# Patient Record
Sex: Male | Born: 1935 | Race: White | Hispanic: No | Marital: Married | Smoking: Former smoker
Health system: Southern US, Community
[De-identification: ages and names within clinical notes are randomized; demographics above are authoritative.]

## PROBLEM LIST (undated history)

## (undated) DIAGNOSIS — E7800 Pure hypercholesterolemia, unspecified: Secondary | ICD-10-CM

## (undated) DIAGNOSIS — R0600 Dyspnea, unspecified: Secondary | ICD-10-CM

## (undated) DIAGNOSIS — I10 Essential (primary) hypertension: Secondary | ICD-10-CM

## (undated) DIAGNOSIS — J9383 Other pneumothorax: Secondary | ICD-10-CM

## (undated) DIAGNOSIS — R011 Cardiac murmur, unspecified: Secondary | ICD-10-CM

## (undated) DIAGNOSIS — J8410 Pulmonary fibrosis, unspecified: Secondary | ICD-10-CM

## (undated) HISTORY — PX: TONSILLECTOMY: SUR1361

---

## 2005-10-01 ENCOUNTER — Ambulatory Visit (HOSPITAL_COMMUNITY): Admission: RE | Admit: 2005-10-01 | Discharge: 2005-10-01 | Payer: Self-pay | Admitting: Internal Medicine

## 2006-08-10 HISTORY — PX: COLONOSCOPY: SHX174

## 2007-04-01 ENCOUNTER — Ambulatory Visit: Payer: Self-pay | Admitting: Internal Medicine

## 2007-04-01 ENCOUNTER — Ambulatory Visit (HOSPITAL_COMMUNITY): Admission: RE | Admit: 2007-04-01 | Discharge: 2007-04-01 | Payer: Self-pay | Admitting: Internal Medicine

## 2007-04-04 ENCOUNTER — Encounter: Payer: Self-pay | Admitting: Internal Medicine

## 2010-03-05 ENCOUNTER — Ambulatory Visit: Payer: Self-pay | Admitting: Cardiovascular Disease

## 2010-03-05 ENCOUNTER — Encounter (INDEPENDENT_AMBULATORY_CARE_PROVIDER_SITE_OTHER): Payer: Self-pay | Admitting: Internal Medicine

## 2010-03-05 ENCOUNTER — Ambulatory Visit (HOSPITAL_COMMUNITY): Admission: RE | Admit: 2010-03-05 | Discharge: 2010-03-05 | Payer: Self-pay | Admitting: Internal Medicine

## 2010-12-23 NOTE — Op Note (Signed)
NAME:  Charles [name], Charles [name]              ACCOUNT NO.:  [id number]   MEDICAL RECORD NO.:  [id number]          PATIENT TYPE:  AMB   LOCATION:  DAY                           FACILITY:  [facility]   PHYSICIAN:  R. Roetta Sessions, M.D. DATE OF BIRTH:  Dec 17, 1932   DATE OF PROCEDURE:  04/01/2007  DATE OF DISCHARGE:                               OPERATIVE REPORT   PROCEDURE PERFORMED:  Colonoscopy, snare polypectomy, and biopsy.   INDICATIONS FOR PROCEDURE:  77 year old gentleman with no lower GI tract  symptoms sent over through the courtesy of Dr. Carylon Perches for his first  ever screening colonoscopy.  There is no family history of colorectal  neoplasia.  Colonoscopy is now being done as a standard screening  maneuver.  This approach has been discussed with the patient at length.  The potential risks, benefits and alternatives have been reviewed,  questions answered and he is agreeable.  Please see documentation in the  medical record.   PROCEDURE NOTE:  O2 saturation, blood pressure, pulse rate and  oxygenation were monitored throughout the entire procedure.  Conscious  station with Versed 5 mg IV, Demerol 775 mg IV in divided doses.  Instrument Pentax video chip system.   FINDINGS:  Digital rectal exam revealed no abnormalities.  The prep was  adequate.  The colonic mucosa was surveyed from the rectosigmoid  junction through the left, transverse, right colon, to appendiceal  orifice, ileocecal valve, and cecum.  These structures were well seen  and photographed for the record.  From this level, the scope was slowly  and cautiously withdrawn. All previously mentioned mucosal surfaces were  again seen. The following abnormalities were noted.  1. The patient had sigmoid diverticula.  2. The patient had an 8 mm sessile polyp in the base of the cecum with      multiple adjacent diminutive polyps.  The sessile polyp was cold      snared and recovered through the scope.  The smaller polyps were  removed with cold biopsy forceps technique.  The remainder of the      colonic mucosa appeared normal.  The scope was pulled down to the      rectum where thorough examination of the rectal mucosa including      retroflex view of the anal verge demonstrated no abnormalities.      The patient tolerated the procedure well, was reacted in endoscopy.   IMPRESSION:  1. Normal rectum.  2. Sigmoid diverticula.  Cecal polyps removed as described above.   RECOMMENDATION:  1. No aspirin or NSAID medications for ten days.  2. Follow up on path.  3. Diverticulosis literature provided to Mr. Proby.  4. Further recommendations to follow.      Charles [name], M.D.  Electronically Signed     RMR/MEDQ  D:  04/01/2007  T:  04/02/2007  Job:  [id number]   cc:   Kingsley Callander. Ouida Sills, MD  Fax: [phone]

## 2011-11-18 ENCOUNTER — Emergency Department (HOSPITAL_COMMUNITY): Payer: Medicare HMO

## 2011-11-18 ENCOUNTER — Inpatient Hospital Stay (HOSPITAL_COMMUNITY)
Admission: EM | Admit: 2011-11-18 | Discharge: 2011-12-01 | DRG: 164 | Disposition: A | Discharge status: Home / Self Care | Payer: Medicare HMO | Attending: Thoracic Surgery (Cardiothoracic Vascular Surgery) | Admitting: Thoracic Surgery (Cardiothoracic Vascular Surgery)

## 2011-11-18 ENCOUNTER — Encounter (HOSPITAL_COMMUNITY): Payer: Self-pay | Admitting: Emergency Medicine

## 2011-11-18 DIAGNOSIS — K5900 Constipation, unspecified: Secondary | ICD-10-CM | POA: Diagnosis present

## 2011-11-18 DIAGNOSIS — D72829 Elevated white blood cell count, unspecified: Secondary | ICD-10-CM | POA: Diagnosis present

## 2011-11-18 DIAGNOSIS — N289 Disorder of kidney and ureter, unspecified: Secondary | ICD-10-CM | POA: Diagnosis present

## 2011-11-18 DIAGNOSIS — J449 Chronic obstructive pulmonary disease, unspecified: Secondary | ICD-10-CM | POA: Diagnosis present

## 2011-11-18 DIAGNOSIS — Z87891 Personal history of nicotine dependence: Secondary | ICD-10-CM

## 2011-11-18 DIAGNOSIS — E871 Hypo-osmolality and hyponatremia: Secondary | ICD-10-CM | POA: Diagnosis present

## 2011-11-18 DIAGNOSIS — D62 Acute posthemorrhagic anemia: Secondary | ICD-10-CM | POA: Diagnosis not present

## 2011-11-18 DIAGNOSIS — J9383 Other pneumothorax: Secondary | ICD-10-CM | POA: Diagnosis present

## 2011-11-18 DIAGNOSIS — E876 Hypokalemia: Secondary | ICD-10-CM | POA: Diagnosis not present

## 2011-11-18 DIAGNOSIS — E785 Hyperlipidemia, unspecified: Secondary | ICD-10-CM | POA: Diagnosis present

## 2011-11-18 DIAGNOSIS — J4489 Other specified chronic obstructive pulmonary disease: Secondary | ICD-10-CM | POA: Diagnosis present

## 2011-11-18 DIAGNOSIS — I10 Essential (primary) hypertension: Secondary | ICD-10-CM | POA: Diagnosis present

## 2011-11-18 HISTORY — DX: Pure hypercholesterolemia, unspecified: E78.00

## 2011-11-18 HISTORY — DX: Essential (primary) hypertension: I10

## 2011-11-18 HISTORY — DX: Other pneumothorax: J93.83

## 2011-11-18 LAB — HEPATIC FUNCTION PANEL
ALT: 12 U/L (ref 0–53)
Albumin: 4.2 g/dL (ref 3.5–5.2)
Alkaline Phosphatase: 102 U/L (ref 39–117)
Total Bilirubin: 0.6 mg/dL (ref 0.3–1.2)
Total Protein: 7.8 g/dL (ref 6.0–8.3)

## 2011-11-18 LAB — CBC
Hemoglobin: 13.1 g/dL (ref 13.0–17.0)
MCH: 35.4 pg — ABNORMAL HIGH (ref 26.0–34.0)
MCV: 101.9 fL — ABNORMAL HIGH (ref 78.0–100.0)
RBC: 3.7 MIL/uL — ABNORMAL LOW (ref 4.22–5.81)
WBC: 9.8 10*3/uL (ref 4.0–10.5)

## 2011-11-18 LAB — BASIC METABOLIC PANEL
CO2: 23 mEq/L (ref 19–32)
Calcium: 10.1 mg/dL (ref 8.4–10.5)
Chloride: 101 mEq/L (ref 96–112)
Creatinine, Ser: 1.16 mg/dL (ref 0.50–1.35)
Glucose, Bld: 178 mg/dL — ABNORMAL HIGH (ref 70–99)
Sodium: 138 mEq/L (ref 135–145)

## 2011-11-18 LAB — LIPASE, BLOOD: Lipase: 64 U/L — ABNORMAL HIGH (ref 11–59)

## 2011-11-18 MED ORDER — SODIUM CHLORIDE 0.9 % IV SOLN
INTRAVENOUS | Status: DC
  Administered 2011-11-18: 1000 mL via INTRAVENOUS
  Administered 2011-11-18 – 2011-11-19 (×2): via INTRAVENOUS
  Administered 2011-11-19: 1000 mL via INTRAVENOUS
  Administered 2011-11-20 – 2011-11-22 (×6): via INTRAVENOUS
  Administered 2011-11-23: 10 mL/h via INTRAVENOUS
  Administered 2011-11-23: 09:00:00 via INTRAVENOUS
  Administered 2011-11-24: 10 mL/h via INTRAVENOUS

## 2011-11-18 MED ORDER — HYDROCODONE-ACETAMINOPHEN 5-325 MG PO TABS
1.0000 | ORAL_TABLET | ORAL | Status: DC | PRN
  Administered 2011-11-23 – 2011-11-24 (×2): 2 via ORAL
  Filled 2011-11-18 (×2): qty 2

## 2011-11-18 MED ORDER — ONDANSETRON HCL 4 MG/2ML IJ SOLN: 4.0000 mg | Freq: Four times a day (QID) | INTRAMUSCULAR | Status: DC | PRN

## 2011-11-18 MED ORDER — ONDANSETRON HCL 4 MG/2ML IJ SOLN
4.0000 mg | Freq: Once | INTRAMUSCULAR | Status: AC
  Administered 2011-11-18: 4 mg via INTRAVENOUS
  Filled 2011-11-18: qty 2

## 2011-11-18 MED ORDER — ENOXAPARIN SODIUM 40 MG/0.4ML ~~LOC~~ SOLN
40.0000 mg | Freq: Every day | SUBCUTANEOUS | Status: DC
  Administered 2011-11-18 – 2011-11-24 (×6): 40 mg via SUBCUTANEOUS
  Filled 2011-11-18 (×6): qty 0.4

## 2011-11-18 MED ORDER — SODIUM CHLORIDE 0.9 % IV BOLUS (SEPSIS)
250.0000 mL | Freq: Once | INTRAVENOUS | Status: AC
  Administered 2011-11-18: 250 mL via INTRAVENOUS

## 2011-11-18 MED ORDER — MORPHINE SULFATE 2 MG/ML IJ SOLN
2.0000 mg | Freq: Once | INTRAMUSCULAR | Status: AC
  Administered 2011-11-18: 2 mg via INTRAVENOUS
  Filled 2011-11-18: qty 1

## 2011-11-18 MED ORDER — BIOTENE DRY MOUTH MT LIQD
15.0000 mL | Freq: Two times a day (BID) | OROMUCOSAL | Status: DC
  Administered 2011-11-18 – 2011-12-01 (×18): 15 mL via OROMUCOSAL

## 2011-11-18 MED ORDER — NITROGLYCERIN 0.4 MG SL SUBL: 0.4000 mg | SUBLINGUAL_TABLET | SUBLINGUAL | Status: DC | PRN

## 2011-11-18 MED ORDER — PANTOPRAZOLE SODIUM 40 MG IV SOLR
40.0000 mg | Freq: Every day | INTRAVENOUS | Status: DC
  Administered 2011-11-18 – 2011-11-23 (×4): 40 mg via INTRAVENOUS
  Filled 2011-11-18 (×6): qty 40

## 2011-11-18 NOTE — H&P (Signed)
Charles [name] is an 78 y.o. male.   Chief Complaint: Chest pain and SOB HPI: Patient presented to [facility] with increasing Right sided chest pain which started this AM.  Had been out chopping wood when the pain started.  It had progressed and the patient presented to the ED.  No similar symptoms in the past.  No other trauma.  No History of any pulmonary disease.  Currently has chest tightness but denies any air hunger.  Past Medical History  Diagnosis Date  . Hypertension   . Hypercholesteremia     Past Surgical History  Procedure Date  . Tonsillectomy     History reviewed. No pertinent family history. Social History:  reports that he quit smoking about 28 years ago. His smoking use included Cigarettes. He quit after 22 years of use. He does not have any smokeless tobacco history on file. He reports that he drinks about 6 ounces of alcohol per week. He reports that he does not use illicit drugs.  Allergies: No Known Allergies  Medications Prior to Admission  Medication Dose Route Frequency Provider Last Rate Last Dose  . 0.9 %  sodium chloride infusion   Intravenous Continuous Shelda Jakes, MD 100 mL/hr at 11/18/11 1127    . antiseptic oral rinse (BIOTENE) solution 15 mL  15 mL Mouth Rinse BID Fabio Bering, MD      . enoxaparin (LOVENOX) injection 40 mg  40 mg Subcutaneous Daily Fabio Bering, MD   40 mg at 11/18/11 1323  . HYDROcodone-acetaminophen (NORCO) 5-325 MG per tablet 1-2 tablet  1-2 tablet Oral Q4H PRN Fabio Bering, MD      . morphine 2 MG/ML injection 2 mg  2 mg Intravenous Once Fabio Bering, MD   2 mg at 11/18/11 1256  . nitroGLYCERIN (NITROSTAT) SL tablet 0.4 mg  0.4 mg Sublingual Q5 min PRN Shelda Jakes, MD      . ondansetron [facility]) injection 4 mg  4 mg Intravenous Once Shelda Jakes, MD   4 mg at 11/18/11 1127  . ondansetron (ZOFRAN) injection 4 mg  4 mg Intravenous Q6H PRN Fabio Bering, MD      . pantoprazole (PROTONIX) injection 40 mg  40  mg Intravenous QHS Fabio Bering, MD      . sodium chloride 0.9 % bolus 250 mL  250 mL Intravenous Once Shelda Jakes, MD   250 mL at 11/18/11 1126   Medications Prior to Admission  Medication Sig Dispense Refill  . allopurinol (ZYLOPRIM) 300 MG tablet Take 300 mg by mouth daily.      Marland Kitchen amLODipine (NORVASC) 10 MG tablet Take 10 mg by mouth daily.      . carvedilol (COREG) 6.25 MG tablet Take 6.25 mg by mouth 2 (two) times daily with a meal.      . losartan (COZAAR) 100 MG tablet Take 100 mg by mouth daily.      Marland Kitchen lovastatin (MEVACOR) 10 MG tablet Take 10 mg by mouth at bedtime.        Results for orders placed during the hospital encounter of 11/18/11 (from the past 48 hour(s))  CBC     Status: Abnormal   Collection Time   11/18/11 11:19 AM      Component Value Range Comment   WBC 9.8  4.0 - 10.5 (K/uL)    RBC 3.70 (*) 4.22 - 5.81 (MIL/uL)    Hemoglobin 13.1  13.0 - 17.0 (g/dL)  HCT 37.7 (*) 39.0 - 52.0 (%)    MCV 101.9 (*) 78.0 - 100.0 (fL)    MCH 35.4 (*) 26.0 - 34.0 (pg)    MCHC 34.7  30.0 - 36.0 (g/dL)    RDW 40.9  81.1 - 91.4 (%)    Platelets 243  150 - 400 (K/uL)   BASIC METABOLIC PANEL     Status: Abnormal   Collection Time   11/18/11 11:19 AM      Component Value Range Comment   Sodium 138  135 - 145 (mEq/L)    Potassium 3.9  3.5 - 5.1 (mEq/L)    Chloride 101  96 - 112 (mEq/L)    CO2 23  19 - 32 (mEq/L)    Glucose, Bld 178 (*) 70 - 99 (mg/dL)    BUN 28 (*) 6 - 23 (mg/dL)    Creatinine, Ser 7.82  0.50 - 1.35 (mg/dL)    Calcium 95.6  8.4 - 10.5 (mg/dL)    GFR calc non Af Amer 59 (*) >90 (mL/min)    GFR calc Af Amer 69 (*) >90 (mL/min)   HEPATIC FUNCTION PANEL     Status: Normal   Collection Time   11/18/11 11:19 AM      Component Value Range Comment   Total Protein 7.8  6.0 - 8.3 (g/dL)    Albumin 4.2  3.5 - 5.2 (g/dL)    AST 29  0 - 37 (U/L)    ALT 12  0 - 53 (U/L)    Alkaline Phosphatase 102  39 - 117 (U/L)    Total Bilirubin 0.6  0.3 - 1.2 (mg/dL)     Bilirubin, Direct 0.1  0.0 - 0.3 (mg/dL)    Indirect Bilirubin 0.5  0.3 - 0.9 (mg/dL)   LIPASE, BLOOD     Status: Abnormal   Collection Time   11/18/11 11:19 AM      Component Value Range Comment   Lipase 64 (*) 11 - 59 (U/L)   TROPONIN I     Status: Normal   Collection Time   11/18/11 11:19 AM      Component Value Range Comment   Troponin I <0.30  <0.30 (ng/mL)    Dg Chest Portable 1 View  11/18/2011  *RADIOLOGY REPORT*  Clinical Data: Chest pain shortness of breath this morning.  PORTABLE CHEST - 1 VIEW  Comparison: None.  Findings: Patient rotated to the left.  No gross tracheal deviation.  Moderate cardiomegaly.  The heart is displaced minimally to the left hemithorax.  No pleural fluid.  A large right- sided pneumothorax, approximately 70%.  Underlying right lung atelectasis.  Interstitial thickening in the left lung without definite focal consolidation.  IMPRESSION: Large right-sided pneumothorax, with mild cardiac/mediastinal deviation to the left. Critical test results telephoned to Dr. Deretha Emory at the time of interpretation at 11:45 a.m. on 11/18/2011.  Original Report Authenticated By: Consuello Bossier, M.D.   Dg Chest Port 1v Same Day  11/18/2011  *RADIOLOGY REPORT*  Clinical Data:  pneumothorax.  PORTABLE CHEST - 1 VIEW SAME DAY  Comparison: 11/18/2011  Findings: Small bore chest tube is noted laterally in the right hemithorax.  Re-expansion of the right lung.  Small right apical pneumothorax persists.  Areas of atelectasis in the right lung. Cardiomegaly with interstitial prominence throughout the lungs. Bibasilar atelectasis.  IMPRESSION: Interval placement of small lateral right chest tube.  Re-expansion of the right lung with small residual right apical pneumothorax.  Cardiomegaly, diffuse interstitial prominence throughout the  lungs.  Bibasilar atelectasis.  Original Report Authenticated By: Cyndie Chime, M.D.    Review of Systems  Constitutional: Negative.   HENT: Negative.     Eyes: Negative.   Respiratory: Positive for shortness of breath.   Cardiovascular: Positive for chest pain.  Gastrointestinal: Negative.   Genitourinary: Negative.   Musculoskeletal: Negative.   Skin: Negative.   Neurological: Negative.   Endo/Heme/Allergies: Negative.   Psychiatric/Behavioral: Negative.     Blood pressure 158/76, pulse 80, temperature 98 F (36.7 C), temperature source Oral, resp. rate 20, height 5\' 11"  (1.803 m), weight 91.627 kg (202 lb), SpO2 96.00%. Physical Exam  Constitutional: He is oriented to person, place, and time. He appears well-developed and well-nourished. No distress.  HENT:  Head: Normocephalic and atraumatic.  Eyes: Conjunctivae and EOM are normal. Pupils are equal, round, and reactive to light.  Neck: Normal range of motion. Neck supple. No tracheal deviation present. No thyromegaly present.  Cardiovascular: Normal rate, regular rhythm and normal heart sounds.   Respiratory: No respiratory distress.       Decreased breath sounds on the Right.  NO crepitance.  Musculoskeletal: Normal range of motion.  Lymphadenopathy:    He has no cervical adenopathy.  Neurological: He is alert and oriented to person, place, and time.  Skin: Skin is warm and dry.     Assessment/Plan Right pneumothorax.  Findings and etiologies discussed with patient.  As this is a spontaneous PTX, options including pneumocath as well as formal thoracostomy tube placement were discussed.  Risks, benefits, and alternatives were discussed.  Patient will have pneumocath placed in ED and then will be admitted for continued inpatient management.  Danene Montijo C 11/18/2011, 9:53 PM

## 2011-11-18 NOTE — ED Notes (Signed)
Pt states he was splitting wood and developed central chest pain. Pt denies any chest pain at this time, but c/o sob.

## 2011-11-18 NOTE — Procedures (Signed)
Chest Tube Insertion Procedure Note  Indications:  Clinically significant Pneumothorax  Pre-operative Diagnosis: Pneumothorax  Post-operative Diagnosis: Pneumothorax  Procedure Details  Informed consent was obtained for the procedure, including sedation.  Risks of lung perforation, hemorrhage, arrhythmia, and adverse drug reaction were discussed.   After sterile skin prep, using standard technique, a 7 French tube was placed in the right lateral approximate 4-5th rib space.  Findings: Air return  Estimated Blood Loss:  Minimal         Specimens:  None              Complications:  None; patient tolerated the procedure well.         Disposition: Admission         Condition: stable  Attending Attestation: I performed the procedure.

## 2011-11-18 NOTE — Progress Notes (Signed)
Did some teaching with patient regarding chest tubes and pneumothorax.  Gave him handouts as well.  He will let us know if he has any further questions.    Reece Packer, RN

## 2011-11-18 NOTE — Progress Notes (Addendum)
Patient brought to room from ER with chest tube in right side of chest.  When connected chest tube to wall suction, large amount of bubbling seen in pleuravac.  Called charge nurse Loletta Specter and Karl Luke in room to further evaluate.  Also had Tory Emerald, [facility] RN come up to check chest tube.  All nurses in agreeance that chest tube may have a possible air leak.  Called Dr. Leticia Penna.  No new orders from MD at this time.  MD stated "it had a good seal on chest xray, call back if any shortness of breath or increased pain."  Patient in no respiratory distress at this time, resting well in bed.  Will continue to monitor.  Schonewitz, Candelaria Stagers 11/18/2011

## 2011-11-18 NOTE — ED Provider Notes (Signed)
History   This chart was scribed for Shelda Jakes, MD by Charles [name]. The patient was seen in room [facility]. Patient's care was started at 1023.   CSN: [id number]  Arrival date & time 11/18/11  1023   First MD Initiated Contact with Patient 11/18/11 1026      Chief Complaint  Patient presents with  . Chest Pain    (Consider location/radiation/quality/duration/timing/severity/associated sxs/prior treatment) HPI  Charles [name] is a 78 y.o. male who presents to the Emergency Department complaining of constant moderate chest pain onset one and half hours ago and persistent since with associated SOB and back pain. Patient had been splitting wood for 45 minutes when chest pain occurred and described the pain as a pressure and constriction feeling with a 3/10 severity. Patient took 2x 325mg  Aspirin PTA with some relief. Patient denies a history of similar chest pain. Patient with history of HTN.    Past Medical History  Diagnosis Date  . Hypertension   . Hypercholesteremia     History reviewed. No pertinent past surgical history.  History reviewed. No pertinent family history.  History  Substance Use Topics  . Smoking status: Not on file  . Smokeless tobacco: Not on file  . Alcohol Use: Yes     heavy      Review of Systems  Constitutional: Negative for fever and chills.  HENT: Negative for congestion, sore throat and neck pain.   Respiratory: Positive for cough.   Cardiovascular: Positive for chest pain.  Gastrointestinal: Negative for nausea, vomiting and diarrhea.  Genitourinary: Negative for dysuria.  Musculoskeletal: Positive for back pain. Negative for joint swelling.  Skin: Negative for rash.  Neurological: Negative for headaches.  All other systems reviewed and are negative.    Allergies  Review of patient's allergies indicates no known allergies.  Home Medications  No current outpatient prescriptions on file.  BP 159/77  Pulse 83   Temp(Src) 98.1 F (36.7 C) (Oral)  Resp 22  Ht 5\' 11"  (1.803 m)  Wt 220 lb (99.791 kg)  BMI 30.68 kg/m2  SpO2 97%  Physical Exam  Nursing note and vitals reviewed. Constitutional: He is oriented to person, place, and time. He appears well-developed and well-nourished. No distress.  HENT:  Head: Normocephalic and atraumatic.  Eyes: EOM are normal. Pupils are equal, round, and reactive to light.  Neck: Neck supple. No tracheal deviation present.  Cardiovascular: Normal rate and normal heart sounds.  Exam reveals no gallop and no friction rub.   No murmur heard. Pulmonary/Chest: Effort normal. No respiratory distress. He has no wheezes. He has no rales.  Abdominal: Soft. Bowel sounds are normal. He exhibits no distension. There is no tenderness.  Musculoskeletal: Normal range of motion. He exhibits no edema and no tenderness.  Lymphadenopathy:    He has no cervical adenopathy.  Neurological: He is alert and oriented to person, place, and time. No cranial nerve deficit or sensory deficit. He exhibits normal muscle tone. Coordination normal.  Skin: Skin is warm and dry.  Psychiatric: He has a normal mood and affect. His behavior is normal.    ED Course  Procedures (including critical care time) DIAGNOSTIC STUDIES: Oxygen Saturation is 88% on Pollard, low by my interpretation.    COORDINATION OF CARE: 11:00AM- Informed patient of possible admission for chest pain and ordering CT angiogram of chest.  12:00AM - Informed patient of right pneumothorax. Calling surgeon for placement of chest tube.   Labs Reviewed  CBC - Abnormal;  Notable for the following:    RBC 3.70 (*)    HCT 37.7 (*)    MCV 101.9 (*)    MCH 35.4 (*)    All other components within normal limits  BASIC METABOLIC PANEL - Abnormal; Notable for the following:    Glucose, Bld 178 (*)    BUN 28 (*)    GFR calc non Af Amer 59 (*)    GFR calc Af Amer 69 (*)    All other components within normal limits  LIPASE, BLOOD -  Abnormal; Notable for the following:    Lipase 64 (*)    All other components within normal limits  HEPATIC FUNCTION PANEL  TROPONIN I   Dg Chest Portable 1 View  11/18/2011  *RADIOLOGY REPORT*  Clinical Data: Chest pain shortness of breath this morning.  PORTABLE CHEST - 1 VIEW  Comparison: None.  Findings: Patient rotated to the left.  No gross tracheal deviation.  Moderate cardiomegaly.  The heart is displaced minimally to the left hemithorax.  No pleural fluid.  A large right- sided pneumothorax, approximately 70%.  Underlying right lung atelectasis.  Interstitial thickening in the left lung without definite focal consolidation.  IMPRESSION: Large right-sided pneumothorax, with mild cardiac/mediastinal deviation to the left. Critical test results telephoned to Dr. Deretha Emory at the time of interpretation at 11:45 a.m. on 11/18/2011.  Original Report Authenticated By: Consuello Bossier, M.D.   Dg Chest Port 1v Same Day  11/18/2011  *RADIOLOGY REPORT*  Clinical Data:  pneumothorax.  PORTABLE CHEST - 1 VIEW SAME DAY  Comparison: 11/18/2011  Findings: Small bore chest tube is noted laterally in the right hemithorax.  Re-expansion of the right lung.  Small right apical pneumothorax persists.  Areas of atelectasis in the right lung. Cardiomegaly with interstitial prominence throughout the lungs. Bibasilar atelectasis.  IMPRESSION: Interval placement of small lateral right chest tube.  Re-expansion of the right lung with small residual right apical pneumothorax.  Cardiomegaly, diffuse interstitial prominence throughout the lungs.  Bibasilar atelectasis.  Original Report Authenticated By: Cyndie Chime, M.D.     Date: 11/18/2011  Rate: 79  Rhythm: normal sinus rhythm  QRS Axis: normal  Intervals: normal  ST/T Wave abnormalities: normal  Conduction Disutrbances:none  Narrative Interpretation:   Old EKG Reviewed: none available  Results for orders placed during the hospital encounter of 11/18/11  CBC        Component Value Range   WBC 9.8  4.0 - 10.5 (K/uL)   RBC 3.70 (*) 4.22 - 5.81 (MIL/uL)   Hemoglobin 13.1  13.0 - 17.0 (g/dL)   HCT 16.1 (*) 09.6 - 52.0 (%)   MCV 101.9 (*) 78.0 - 100.0 (fL)   MCH 35.4 (*) 26.0 - 34.0 (pg)   MCHC 34.7  30.0 - 36.0 (g/dL)   RDW 04.5  40.9 - 81.1 (%)   Platelets 243  150 - 400 (K/uL)  BASIC METABOLIC PANEL      Component Value Range   Sodium 138  135 - 145 (mEq/L)   Potassium 3.9  3.5 - 5.1 (mEq/L)   Chloride 101  96 - 112 (mEq/L)   CO2 23  19 - 32 (mEq/L)   Glucose, Bld 178 (*) 70 - 99 (mg/dL)   BUN 28 (*) 6 - 23 (mg/dL)   Creatinine, Ser 9.14  0.50 - 1.35 (mg/dL)   Calcium 78.2  8.4 - 10.5 (mg/dL)   GFR calc non Af Amer 59 (*) >90 (mL/min)   GFR calc Af  Amer 69 (*) >90 (mL/min)  HEPATIC FUNCTION PANEL      Component Value Range   Total Protein 7.8  6.0 - 8.3 (g/dL)   Albumin 4.2  3.5 - 5.2 (g/dL)   AST 29  0 - 37 (U/L)   ALT 12  0 - 53 (U/L)   Alkaline Phosphatase 102  39 - 117 (U/L)   Total Bilirubin 0.6  0.3 - 1.2 (mg/dL)   Bilirubin, Direct 0.1  0.0 - 0.3 (mg/dL)   Indirect Bilirubin 0.5  0.3 - 0.9 (mg/dL)  LIPASE, BLOOD      Component Value Range   Lipase 64 (*) 11 - 59 (U/L)  TROPONIN I      Component Value Range   Troponin I <0.30  <0.30 (ng/mL)    Diagnosis: Right pneumothorax spontaneous   Results for orders placed during the hospital encounter of 11/18/11  CBC      Component Value Range   WBC 9.8  4.0 - 10.5 (K/uL)   RBC 3.70 (*) 4.22 - 5.81 (MIL/uL)   Hemoglobin 13.1  13.0 - 17.0 (g/dL)   HCT 14.7 (*) 82.9 - 52.0 (%)   MCV 101.9 (*) 78.0 - 100.0 (fL)   MCH 35.4 (*) 26.0 - 34.0 (pg)   MCHC 34.7  30.0 - 36.0 (g/dL)   RDW 56.2  13.0 - 86.5 (%)   Platelets 243  150 - 400 (K/uL)  BASIC METABOLIC PANEL      Component Value Range   Sodium 138  135 - 145 (mEq/L)   Potassium 3.9  3.5 - 5.1 (mEq/L)   Chloride 101  96 - 112 (mEq/L)   CO2 23  19 - 32 (mEq/L)   Glucose, Bld 178 (*) 70 - 99 (mg/dL)   BUN 28 (*) 6 - 23  (mg/dL)   Creatinine, Ser 7.84  0.50 - 1.35 (mg/dL)   Calcium 69.6  8.4 - 10.5 (mg/dL)   GFR calc non Af Amer 59 (*) >90 (mL/min)   GFR calc Af Amer 69 (*) >90 (mL/min)  HEPATIC FUNCTION PANEL      Component Value Range   Total Protein 7.8  6.0 - 8.3 (g/dL)   Albumin 4.2  3.5 - 5.2 (g/dL)   AST 29  0 - 37 (U/L)   ALT 12  0 - 53 (U/L)   Alkaline Phosphatase 102  39 - 117 (U/L)   Total Bilirubin 0.6  0.3 - 1.2 (mg/dL)   Bilirubin, Direct 0.1  0.0 - 0.3 (mg/dL)   Indirect Bilirubin 0.5  0.3 - 0.9 (mg/dL)  LIPASE, BLOOD      Component Value Range   Lipase 64 (*) 11 - 59 (U/L)  TROPONIN I      Component Value Range   Troponin I <0.30  <0.30 (ng/mL)     MDM  Patient presented with chest pain radiating to the back along with shortness of breath initially concerned about cardiac or dissection of the thoracic aorta however chest x-ray revealed a large right-sided pneumothorax 70% or greater. Dr. Leticia Penna for surgery was contacted he came in and placed a small pneumocath to reinflate the lung and is admitted the patient. Troponin was negative rest of labs without any significant findings CT angiogram was originally ordered was canceled after the chest x-ray showed a large pneumothorax.      I personally performed the services described in this documentation, which was scribed in my presence. The recorded information has been reviewed and considered.  Shelda Jakes, MD 11/18/11 [phone]

## 2011-11-18 NOTE — ED Notes (Signed)
Pt states that while out splitting wood today became sob and had cp. Pt states he got a little over heated in the sun today. Pt denies cp at this time and states still sob.

## 2011-11-19 NOTE — Progress Notes (Signed)
   CARE MANAGEMENT NOTE 11/19/2011  Patient:  ARGEL, PABLO   Account Number:  [id number]  Date Initiated:  11/19/2011  Documentation initiated by:  Sharrie Rothman  Subjective/Objective Assessment:   Pt admitted from home with pneumothorax. Pt lives with wife and is indepedent with ADL's PTA.     Action/Plan:   No CM needs noted.   Anticipated DC Date:  11/25/2011   Anticipated DC Plan:  HOME/SELF CARE      DC Planning Services  CM consult      Choice offered to / List presented to:             Status of service:  In process, will continue to follow Medicare Important Message given?   (If response is "NO", the following Medicare IM given date fields will be blank) Date Medicare IM given:   Date Additional Medicare IM given:    Discharge Disposition:  HOME/SELF CARE  Per UR Regulation:    If discussed at Long Length of Stay Meetings, dates discussed:    Comments:  11/19/11 1400 Arlyss Queen, RN BSN CM

## 2011-11-19 NOTE — Progress Notes (Signed)
UR Chart Review Completed  

## 2011-11-19 NOTE — Progress Notes (Signed)
  Subjective: No SOB.  No CP.    Objective: Vital signs in last 24 hours: Temp:  [97.6 F (36.4 [name])-98.1 F (36.7 [name])] 98.1 F (36.7 [name]) (04/11 2135) Pulse Rate:  [75-82] 82  (04/11 2135) Resp:  [20] 20  (04/11 2135) BP: (144-159)/(71-74) 159/74 mmHg (04/11 2135) SpO2:  [90 %-92 %] 92 % (04/11 2135) Last BM Date: 11/18/11  Intake/Output from previous day: 04/10 0701 - 04/11 0700 In: -  Out: 1750 [Urine:1700; Chest Tube:50] Intake/Output this shift:    General appearance: alert and no distress Resp: clear to auscultation bilaterally Chest wall: CT i place.  Small intermittant air leak.  CT to -20cm H2O  Lab Results:   [facility] 11/18/11 1119  WBC 9.8  HGB 13.1  HCT 37.7*  PLT 243   BMET  Basename 11/18/11 1119  NA 138  K 3.9  CL 101  CO2 23  GLUCOSE 178*  BUN 28*  CREATININE 1.16  CALCIUM 10.1   PT/INR No results found for this basename: LABPROT:2,INR:2 in the last 72 hours ABG No results found for this basename: PHART:2,PCO2:2,PO2:2,HCO3:2 in the last 72 hours  Studies/Results: Dg Chest Portable 1 View  11/18/2011  *RADIOLOGY REPORT*  Clinical Data: Chest pain shortness of breath this morning.  PORTABLE CHEST - 1 VIEW  Comparison: None.  Findings: Patient rotated to the left.  No gross tracheal deviation.  Moderate cardiomegaly.  The heart is displaced minimally to the left hemithorax.  No pleural fluid.  A large right- sided pneumothorax, approximately 70%.  Underlying right lung atelectasis.  Interstitial thickening in the left lung without definite focal consolidation.  IMPRESSION: Large right-sided pneumothorax, with mild cardiac/mediastinal deviation to the left. Critical test results telephoned to Dr. Deretha Emory at the time of interpretation at 11:45 a.m. on 11/18/2011.  Original Report Authenticated By: Consuello Bossier, M.D.   Dg Chest Port 1v Same Day  11/18/2011  *RADIOLOGY REPORT*  Clinical Data:  pneumothorax.  PORTABLE CHEST - 1 VIEW SAME DAY   Comparison: 11/18/2011  Findings: Small bore chest tube is noted laterally in the right hemithorax.  Re-expansion of the right lung.  Small right apical pneumothorax persists.  Areas of atelectasis in the right lung. Cardiomegaly with interstitial prominence throughout the lungs. Bibasilar atelectasis.  IMPRESSION: Interval placement of small lateral right chest tube.  Re-expansion of the right lung with small residual right apical pneumothorax.  Cardiomegaly, diffuse interstitial prominence throughout the lungs.  Bibasilar atelectasis.  Original Report Authenticated By: Cyndie Chime, M.D.    Anti-infectives: Anti-infectives    None      Assessment/Plan: s/p * No surgery found * Spontaneous PTX.  Still with small air leak.  Continue to -20cm H2O.  Re-check xray in AM.  LOS: 1 day    Charles [name] 11/19/2011

## 2011-11-20 ENCOUNTER — Inpatient Hospital Stay (HOSPITAL_COMMUNITY): Payer: Medicare HMO

## 2011-11-20 HISTORY — PX: CHEST TUBE INSERTION: SHX231

## 2011-11-20 MED ORDER — LIDOCAINE HCL (PF) 2 % IJ SOLN
INTRAMUSCULAR | Status: AC
  Filled 2011-11-20: qty 2

## 2011-11-20 MED ORDER — MORPHINE SULFATE 4 MG/ML IJ SOLN
INTRAMUSCULAR | Status: AC
  Filled 2011-11-20: qty 1

## 2011-11-20 MED ORDER — ALBUTEROL SULFATE (5 MG/ML) 0.5% IN NEBU
INHALATION_SOLUTION | RESPIRATORY_TRACT | Status: AC
  Filled 2011-11-20: qty 0.5

## 2011-11-20 MED ORDER — LORAZEPAM 2 MG/ML IJ SOLN
INTRAMUSCULAR | Status: AC
  Filled 2011-11-20: qty 1

## 2011-11-20 MED ORDER — MORPHINE SULFATE 4 MG/ML IJ SOLN
4.0000 mg | Freq: Once | INTRAMUSCULAR | Status: AC
  Administered 2011-11-20: 4 mg via INTRAVENOUS

## 2011-11-20 MED ORDER — LORAZEPAM 2 MG/ML IJ SOLN
2.0000 mg | Freq: Once | INTRAMUSCULAR | Status: AC
  Administered 2011-11-20: 2 mg via INTRAVENOUS

## 2011-11-20 MED ORDER — LIDOCAINE HCL (PF) 1 % IJ SOLN
INTRAMUSCULAR | Status: AC
  Administered 2011-11-20: 5 mL via SUBCUTANEOUS
  Filled 2011-11-20: qty 5

## 2011-11-20 MED ORDER — ALBUTEROL SULFATE (5 MG/ML) 0.5% IN NEBU
2.5000 mg | INHALATION_SOLUTION | RESPIRATORY_TRACT | Status: DC | PRN
  Administered 2011-11-20 – 2011-11-25 (×2): 2.5 mg via RESPIRATORY_TRACT
  Filled 2011-11-20: qty 0.5

## 2011-11-20 NOTE — Procedures (Signed)
Chest Tube Insertion Procedure Note  Indications:  Clinically significant Pneumothorax recurrent   Pre-operative Diagnosis: Pneumothorax,  pneumocath kinked at skin level.  Post-operative Diagnosis: Pneumothorax  Procedure Details  Informed consent was obtained for the procedure, including sedation.  Risks of lung perforation, hemorrhage, arrhythmia, and adverse drug reaction were discussed.   After sterile skin prep, using standard technique, a 24 French tube was placed in the right lateral 4-5 th rib space.  Findings: None  Air leak  Estimated Blood Loss:  Minimal         Specimens:  None              Complications:  None; patient tolerated the procedure well.         Disposition: continued inpatient status         Condition: stable  Attending Attestation: I performed the procedure.

## 2011-11-20 NOTE — Progress Notes (Signed)
Last evening, patient's current chest tube stopped working and patient became SOB and was satting 81% on 2 liters.  He was taken up to 3, 3.5, then 4 to get him up to 88%.  Respiratory was called.  Dr. Leticia Penna was called.  He stated to turn the suction to -30, then to -40 if needed, which was done.  Nothing was resolved.  I spoke to him again and he stated he was on his way in to put in another chest tube.  The items were retrieved.  I was given an order to give patient 4 mg of morphine, which was done.  Once Dr. Leticia Penna arrived, an order was given to give 2 mg of Ativan, which was done.  Chest tube was put in and it is pulling out serousanginous fluid at this time.  A chest x-ray was taken to confirm position and showed that the right lung was reinflated except for a small portion on the very top.  Some opacity was seen at the bases as well.  Patient has been very confused since the medication was given.  His bed alarm is on and he is being watched closely.  I tried to call the patient's wife at both the home number and mobile number, but did not receive an answer at either one.     Reece Packer, RN

## 2011-11-20 NOTE — Progress Notes (Signed)
Subjective: No acute change.  No increased SOB.  No increased CP.   Objective: Vital signs in last 24 hours: Temp:  [97.8 F (36.6 C)-98.1 F (36.7 C)] 97.8 F (36.6 C) (04/12 1354) Pulse Rate:  [82-88] 86  (04/12 1354) Resp:  [20] 20  (04/12 1354) BP: (159-178)/(71-85) 178/71 mmHg (04/12 1354) SpO2:  [92 %-96 %] 96 % (04/12 1354) Last BM Date: 11/18/11  Intake/Output from previous day: 04/11 0701 - 04/12 0700 In: -  Out: 975 [Urine:975] Intake/Output this shift: Total I/O In: 5558.3 [P.O.:360; I.V.:5198.3] Out: 400 [Urine:400]  General appearance: alert and no distress Resp: Full breath sounds bilaterally. Unlabored respirations. Chest tube in position. Small air leak. Chest tubes to -20 cm H2O.  Lab Results:   [facility] 11/18/11 1119  WBC 9.8  HGB 13.1  HCT 37.7*  PLT 243   BMET  Basename 11/18/11 1119  NA 138  K 3.9  CL 101  CO2 23  GLUCOSE 178*  BUN 28*  CREATININE 1.16  CALCIUM 10.1   PT/INR No results found for this basename: LABPROT:2,INR:2 in the last 72 hours ABG No results found for this basename: PHART:2,PCO2:2,PO2:2,HCO3:2 in the last 72 hours  Studies/Results: Dg Chest Port 1 View  11/20/2011  *RADIOLOGY REPORT*  Clinical Data: Follow up of right pneumothorax  PORTABLE CHEST - 1 VIEW  Comparison: Portable chest x-ray of 11/20/2011  Findings: There is little change in the small right apical pneumothorax.  A right chest tube remains.  The lungs remain poorly aerated with coarse markings bilaterally.  Cardiomegaly is stable.  IMPRESSION: No significant change in small right apical pneumothorax with right chest tube remaining.  Diminished aeration.  Original Report Authenticated By: Juline Patch, M.D.   Dg Chest Port 1 View  11/20/2011  *RADIOLOGY REPORT*  Clinical Data: Sudden onset of shortness of breath.  PORTABLE CHEST - 1 VIEW  Comparison: Chest radiograph performed 11/18/2011  Findings: The patient's small right-sided chest tube is still  noted ending overlying the right midlung.  However, there has been interval recurrence of the patient's large right-sided pneumothorax, with near complete collapse of the right lung.  This may reflect lung adherent to the end of the chest tube; would suggest slight repositioning and flushing of the chest tube.  Patchy left-sided airspace opacity raises concern for pneumonia, though this may reflect chronic interstitial lung disease.  Would suggest clinical correlation for history of prior lung disease.  No significant pleural effusion is identified.  The heart mediastinal silhouette is borderline normal in size.  No acute osseous abnormalities are seen.  IMPRESSION:  1.  Interval recurrence of the large right-sided pneumothorax, with near-complete collapse of the right lung.  However, the small right- sided chest tube is still noted ending overlying the right midlung. This may reflect lung adherent to the end of the chest tube; would suggest slight repositioning and flushing of the chest tube with air. 2.  Patchy left-sided airspace opacity raises concern for pneumonia, though this may reflect chronic interstitial lung disease.  Would suggest clinical correlation for history of prior lung disease.  Critical Value/emergent results were called by telephone at the time of interpretation on 11/20/2011  at 12:36 a.m.  to  Lupita Leash RN on [facility] 3rd Floor, who verbally acknowledged these results.  Original Report Authenticated By: Tonia Ghent, M.D.   Dg Chest Port 1v Same Day  11/20/2011  *RADIOLOGY REPORT*  Clinical Data: Insertion of new right-sided chest tube.  PORTABLE CHEST - 1 VIEW  SAME DAY  Comparison: Chest radiograph performed earlier today at 12:20 a.m.  Findings: There has been interval placement of a right apical chest tube, with re-expansion of the right lung.  There is suggestion of a residual small right apical pneumothorax.  Diffuse left-sided airspace opacification and mild right basilar airspace  opacification are again noted.  This may reflect underlying interstitial lung disease or possibly pneumonia.  No definite pleural effusion is identified.  The cardiomediastinal silhouette is borderline normal in size.  No acute osseous abnormalities are identified.  Scattered soft tissue air is noted along the right chest wall.  IMPRESSION:  1.  Interval placement of right apical chest tube, with re- expansion of the right lung.  Residual small right apical pneumothorax noted. 2.  Diffuse left-sided airspace opacification and mild right basilar airspace opacity may reflect underlying interstitial lung disease or possibly pneumonia. 2.  Scattered soft tissue air along the right chest wall.  Original Report Authenticated By: Tonia Ghent, M.D.    Anti-infectives: Anti-infectives    None      Assessment/Plan: s/p * No surgery found * Right pneumothorax. Patient now with larger caliber thoracostomy tube. Still a small residual air leak. We'll continue chest tube to -27 m H2O.  LOS: 2 days    Myelle Poteat C 11/20/2011

## 2011-11-21 ENCOUNTER — Inpatient Hospital Stay (HOSPITAL_COMMUNITY): Payer: Medicare HMO

## 2011-11-21 MED ORDER — CHLORTHALIDONE 25 MG PO TABS
25.0000 mg | ORAL_TABLET | Freq: Every day | ORAL | Status: DC
  Administered 2011-11-22 – 2011-11-25 (×3): 25 mg via ORAL
  Filled 2011-11-21 (×6): qty 1

## 2011-11-21 MED ORDER — AMLODIPINE BESYLATE 10 MG PO TABS
10.0000 mg | ORAL_TABLET | Freq: Every day | ORAL | Status: DC
  Administered 2011-11-22 – 2011-11-24 (×3): 10 mg via ORAL
  Filled 2011-11-21 (×3): qty 1

## 2011-11-21 MED ORDER — LOVASTATIN 20 MG PO TABS
10.0000 mg | ORAL_TABLET | Freq: Every day | ORAL | Status: DC
  Administered 2011-11-23 – 2011-11-30 (×8): 10 mg via ORAL
  Filled 2011-11-21 (×14): qty 1

## 2011-11-21 MED ORDER — ASPIRIN 325 MG PO TABS
325.0000 mg | ORAL_TABLET | Freq: Every day | ORAL | Status: DC
  Administered 2011-11-22: 325 mg via ORAL

## 2011-11-21 MED ORDER — LOSARTAN POTASSIUM 50 MG PO TABS: 100.0000 mg | ORAL_TABLET | Freq: Every day | ORAL | Status: DC

## 2011-11-21 MED ORDER — ATORVASTATIN CALCIUM 10 MG PO TABS: 5.0000 mg | ORAL_TABLET | Freq: Every day | ORAL | Status: DC

## 2011-11-21 MED ORDER — ALLOPURINOL 300 MG PO TABS
300.0000 mg | ORAL_TABLET | Freq: Every day | ORAL | Status: DC
  Administered 2011-11-22 – 2011-11-24 (×3): 300 mg via ORAL
  Filled 2011-11-21 (×3): qty 1

## 2011-11-21 MED ORDER — CARVEDILOL 6.25 MG PO TABS
6.2500 mg | ORAL_TABLET | Freq: Two times a day (BID) | ORAL | Status: DC
  Administered 2011-11-22 – 2011-11-25 (×5): 6.25 mg via ORAL
  Filled 2011-11-21 (×6): qty 2

## 2011-11-21 MED ORDER — SIMVASTATIN 10 MG PO TABS: 10.0000 mg | ORAL_TABLET | Freq: Every day | ORAL | Status: DC

## 2011-11-21 MED ORDER — GEMFIBROZIL 600 MG PO TABS
600.0000 mg | ORAL_TABLET | Freq: Two times a day (BID) | ORAL | Status: DC
  Administered 2011-11-22 – 2011-11-24 (×4): 600 mg via ORAL
  Filled 2011-11-21 (×6): qty 1

## 2011-11-21 NOTE — Progress Notes (Signed)
Notified Dr. Leticia Penna about the patients chest tube audible hissing sound,  Bloody drainage, and pt condition.  Currently there is still bubbling in the Chest tube chamber, maybe so what more that when he was here before.  Voiced to him that xray was actually here to do and chest xray per order.  He stated to change the dressing and add a Vaseline gauze dressing at the site. I stated to him that I would notify him as needed.  I will continue to monitor the patient.  Pt is in stable condition at this time.

## 2011-11-21 NOTE — Progress Notes (Signed)
Pharmacy will need to package and inspect home medications in the AM.  Mady Gemma, [facility] 11/21/2011 7:36 PM

## 2011-11-21 NOTE — Progress Notes (Signed)
Pt verbalized his concerns of taking  Medications supplied here by the hospital due to cost.  MD stated that the patient could restart his home medication and  take his own medications.  Discussed with the patient the process for him take his medications.  He and his family verbalized understanding.  I discussed the situation with the pharmacist at [facility] and with Jae Dire our house supervisor.  I ordered all his meds as prescribed on his medication bottles.  Will continue to monitor.

## 2011-11-21 NOTE — Progress Notes (Signed)
While doing rounds this am the patient asked me to give his bottles of medications that he had in the room so he could take his own medications.  He voiced that he did not want to pay 3x as much for medications here and could very well take his dose he has from home.  I discussed with with that he should not take any other medications before letting the nurse know since he could have a reaction.  I also told him that I would discuss the issue with his MD and could and if the MD agrees could place an order for him to take his home meds.  Pt proceeded to take his own medications, including 2 white capsules that he stated was Niacin.  Pt discussed the issue with Dr. Dian Situ and he stated that it was fine for him to take his meds from home and that he would place a order for the pharmacist to come up and see him.

## 2011-11-21 NOTE — Progress Notes (Signed)
Notified by patients wife to come to the room because patients dressing was saturated with blood. On assessment, there was copious bright red blood. There was an audible hissing sound that appeared to come from the insertion site. Patient was stable as evidenced by no complaints of SOB, pain, or discomfort. Doctor Ziglar was notified about the patients condition and chest tube.

## 2011-11-21 NOTE — Progress Notes (Signed)
Pt output from his Chest tube is approx 300cc's from 4/12 afternoon until now.

## 2011-11-21 NOTE — Progress Notes (Signed)
Subjective: No complaint of shortness of breath. No chest pain. Tender around tube but otherwise no complaints.  Objective: Vital signs in last 24 hours: Temp:  [97.8 F (36.6 C)-97.9 F (36.6 C)] 97.9 F (36.6 C) (04/13 0531) Pulse Rate:  [86-99] 99  (04/13 0531) Resp:  [20] 20  (04/13 0531) BP: (165-178)/(71-77) 165/77 mmHg (04/13 0531) SpO2:  [96 %] 96 % (04/13 0531) Last BM Date: 11/18/11  Intake/Output from previous day: 04/12 0701 - 04/13 0700 In: 7198.3 [P.O.:600; I.V.:6598.3] Out: 2900 [Urine:2900] Intake/Output this shift:    General appearance: alert and no distress Resp: Full breath sounds bilaterally. Thoracostomy tube is in position on the right side. Chest tubes to -20 cm of H2O. There is a small persistent but intermittent air leak noted within the canister.  Lab Results:   [facility] 11/18/11 1119  WBC 9.8  HGB 13.1  HCT 37.7*  PLT 243   BMET  Basename 11/18/11 1119  NA 138  K 3.9  CL 101  CO2 23  GLUCOSE 178*  BUN 28*  CREATININE 1.16  CALCIUM 10.1   PT/INR No results found for this basename: LABPROT:2,INR:2 in the last 72 hours ABG No results found for this basename: PHART:2,PCO2:2,PO2:2,HCO3:2 in the last 72 hours  Studies/Results: Dg Chest Port 1 View  11/20/2011  *RADIOLOGY REPORT*  Clinical Data: Follow up of right pneumothorax  PORTABLE CHEST - 1 VIEW  Comparison: Portable chest x-ray of 11/20/2011  Findings: There is little change in the small right apical pneumothorax.  A right chest tube remains.  The lungs remain poorly aerated with coarse markings bilaterally.  Cardiomegaly is stable.  IMPRESSION: No significant change in small right apical pneumothorax with right chest tube remaining.  Diminished aeration.  Original Report Authenticated By: Juline Patch, M.D.   Dg Chest Port 1 View  11/20/2011  *RADIOLOGY REPORT*  Clinical Data: Sudden onset of shortness of breath.  PORTABLE CHEST - 1 VIEW  Comparison: Chest radiograph performed  11/18/2011  Findings: The patient's small right-sided chest tube is still noted ending overlying the right midlung.  However, there has been interval recurrence of the patient's large right-sided pneumothorax, with near complete collapse of the right lung.  This may reflect lung adherent to the end of the chest tube; would suggest slight repositioning and flushing of the chest tube.  Patchy left-sided airspace opacity raises concern for pneumonia, though this may reflect chronic interstitial lung disease.  Would suggest clinical correlation for history of prior lung disease.  No significant pleural effusion is identified.  The heart mediastinal silhouette is borderline normal in size.  No acute osseous abnormalities are seen.  IMPRESSION:  1.  Interval recurrence of the large right-sided pneumothorax, with near-complete collapse of the right lung.  However, the small right- sided chest tube is still noted ending overlying the right midlung. This may reflect lung adherent to the end of the chest tube; would suggest slight repositioning and flushing of the chest tube with air. 2.  Patchy left-sided airspace opacity raises concern for pneumonia, though this may reflect chronic interstitial lung disease.  Would suggest clinical correlation for history of prior lung disease.  Critical Value/emergent results were called by telephone at the time of interpretation on 11/20/2011  at 12:36 a.m.  to  Lupita Leash RN on [facility] 3rd Floor, who verbally acknowledged these results.  Original Report Authenticated By: Tonia Ghent, M.D.   Dg Chest Port 1v Same Day  11/20/2011  *RADIOLOGY REPORT*  Clinical Data: Insertion  of new right-sided chest tube.  PORTABLE CHEST - 1 VIEW SAME DAY  Comparison: Chest radiograph performed earlier today at 12:20 a.m.  Findings: There has been interval placement of a right apical chest tube, with re-expansion of the right lung.  There is suggestion of a residual small right apical pneumothorax.   Diffuse left-sided airspace opacification and mild right basilar airspace opacification are again noted.  This may reflect underlying interstitial lung disease or possibly pneumonia.  No definite pleural effusion is identified.  The cardiomediastinal silhouette is borderline normal in size.  No acute osseous abnormalities are identified.  Scattered soft tissue air is noted along the right chest wall.  IMPRESSION:  1.  Interval placement of right apical chest tube, with re- expansion of the right lung.  Residual small right apical pneumothorax noted. 2.  Diffuse left-sided airspace opacification and mild right basilar airspace opacity may reflect underlying interstitial lung disease or possibly pneumonia. 2.  Scattered soft tissue air along the right chest wall.  Original Report Authenticated By: Tonia Ghent, M.D.    Anti-infectives: Anti-infectives    None      Assessment/Plan: s/p * No surgery found * Right-sided pneumothorax. Continued small air leak. I will increase the suction to -30 cm of H2O to see if this improves resolution. Chest x-ray ordered for later today. Should he continue to have a persistent leak over the weekend patient may require referral to thoracic surgery.  LOS: 3 days    Vincentina Sollers C 11/21/2011

## 2011-11-22 MED ORDER — LOSARTAN POTASSIUM 100 MG PO TABS
100.0000 mg | ORAL_TABLET | Freq: Every day | ORAL | Status: DC
  Filled 2011-11-22 (×3): qty 1

## 2011-11-22 MED ORDER — SODIUM CHLORIDE 0.9 % IJ SOLN
INTRAMUSCULAR | Status: AC
  Administered 2011-11-22: 10 mL
  Filled 2011-11-22: qty 3

## 2011-11-22 NOTE — Progress Notes (Signed)
Pt is having a hard time dealing with his medications.  I have tried to explain to him that his med's is being packaged for a few say supply, but the med's need to stay in the pharmacy as long as he is here so that they can continue to package his, so he is not charged for ours.  He states that he takes his pills at a certain time and he didn't t know which ways.  I told him I ordered him exactly the way they were ordered on the bottles.  He still does not seem to understand.  I will continue to try to explain as needed.

## 2011-11-22 NOTE — Progress Notes (Signed)
Pt continues to not want to abide by the [facility] policy in regards to his medications.  He states that he wants his property back, which is his pills.  I voiced to him I had notified Glorianne Manchester, Rn and she would bring his medication to him.  He continues to refuse to take the medications that I have brought in for dinner time.  I asked him how we were going to use his medications so we don't have to use our when he states that he is sending them home with his wife?  His wife states that she packages them and will bring them to him.  I have tried to explain the process many times today offering to set the medications up as he takes them at home instead of what prescribed on the bottle.  He still refuses.  I will pass on to [facility] about the situation in report and see if she can accomplish him taking his medications.  Glorianne Manchester - house supervisor was notified of the entire situation.

## 2011-11-22 NOTE — Progress Notes (Signed)
  Subjective: Denies any shortness of breath or chest pain. States he actually feels very well.  Objective: Vital signs in last 24 hours: Temp:  [97.8 F (36.6 [name])-98 F (36.7 [name])] 97.8 F (36.6 [name]) (04/14 0530) Pulse Rate:  [75-85] 85  (04/14 0530) Resp:  [20] 20  (04/14 0530) BP: (143-170)/(73-75) 156/73 mmHg (04/14 0530) SpO2:  [92 %-93 %] 93 % (04/14 0530) Last BM Date: 11/18/11  Intake/Output from previous day: 04/13 0701 - 04/14 0700 In: 460 [P.O.:360] Out: 3550 [Urine:3500; Chest Tube:50] Intake/Output this shift: Total I/O In: -  Out: 1 [Stool:1]  General appearance: alert and no distress Resp: Full sounds bilaterally. Chest tube is in position. Continues to have intermittent air leak and a Pleur-evac. I have returned back to -20 cm H2O as the increased to -30 did not appear to have any affects on air leak. I also clamped to a different position the stricture there was any actual leak from the tube itself. Air leak was only evident when entire tube was not clamped.  Lab Results:  No results found for this basename: WBC:2,HGB:2,HCT:2,PLT:2 in the last 72 hours BMET No results found for this basename: NA:2,K:2,CL:2,CO2:2,GLUCOSE:2,BUN:2,CREATININE:2,CALCIUM:2 in the last 72 hours PT/INR No results found for this basename: LABPROT:2,INR:2 in the last 72 hours ABG No results found for this basename: PHART:2,PCO2:2,PO2:2,HCO3:2 in the last 72 hours  Studies/Results: Dg Chest Port 1 View  11/21/2011  *RADIOLOGY REPORT*  Clinical Data: Follow-up right pneumothorax  PORTABLE CHEST - 1 VIEW  Comparison: 11/20/2011  Findings: A tiny right apical pneumothorax is again noted and relatively unchanged. Right thoracostomy tube is again identified. Bilateral airspace opacities have slightly improved. Small amount of subcutaneous emphysema on the right is again noted. The cardiomediastinal silhouette is stable.  IMPRESSION: Stable tiny right apical pneumothorax and right thoracostomy  tube.  Improved bilateral airspace opacities.  Original Report Authenticated By: Rosendo Gros, M.D.   Dg Chest Port 1 View  11/20/2011  *RADIOLOGY REPORT*  Clinical Data: Follow up of right pneumothorax  PORTABLE CHEST - 1 VIEW  Comparison: Portable chest x-ray of 11/20/2011  Findings: There is little change in the small right apical pneumothorax.  A right chest tube remains.  The lungs remain poorly aerated with coarse markings bilaterally.  Cardiomegaly is stable.  IMPRESSION: No significant change in small right apical pneumothorax with right chest tube remaining.  Diminished aeration.  Original Report Authenticated By: Juline Patch, M.D.    Anti-infectives: Anti-infectives    None      Assessment/Plan: s/p * No surgery found * Right pneumothorax. Patient has persistent air leak. At this point I am highly suspicious that the air will stop without further intervention. I did discuss with the patient and family continued monitoring but will get referral for thoracic surgery if the air leak persists. Continue chest tube in -20 cm H2O for now.  LOS: 4 days    Charles [name] 11/22/2011

## 2011-11-23 ENCOUNTER — Inpatient Hospital Stay (HOSPITAL_COMMUNITY): Payer: Medicare HMO

## 2011-11-23 ENCOUNTER — Encounter (HOSPITAL_COMMUNITY): Payer: Self-pay | Admitting: Thoracic Surgery (Cardiothoracic Vascular Surgery)

## 2011-11-23 DIAGNOSIS — J9383 Other pneumothorax: Secondary | ICD-10-CM

## 2011-11-23 DIAGNOSIS — E785 Hyperlipidemia, unspecified: Secondary | ICD-10-CM | POA: Diagnosis present

## 2011-11-23 DIAGNOSIS — J930 Spontaneous tension pneumothorax: Secondary | ICD-10-CM

## 2011-11-23 DIAGNOSIS — I10 Essential (primary) hypertension: Secondary | ICD-10-CM | POA: Diagnosis present

## 2011-11-23 HISTORY — DX: Other pneumothorax: J93.83

## 2011-11-23 MED ORDER — LOSARTAN POTASSIUM 50 MG PO TABS
100.0000 mg | ORAL_TABLET | Freq: Every day | ORAL | Status: DC
  Administered 2011-11-23 – 2011-11-24 (×2): 100 mg via ORAL
  Filled 2011-11-23 (×3): qty 2

## 2011-11-23 MED ORDER — HYDROMORPHONE HCL PF 1 MG/ML IJ SOLN: 0.5000 mg | INTRAMUSCULAR | Status: DC | PRN

## 2011-11-23 NOTE — Progress Notes (Signed)
Called report to [facility], RN @ [facility] 2000.  Verbalized understanding.  Pt transferred to [facility] via [facility].  Schonewitz, Candelaria Stagers  11/23/2011

## 2011-11-23 NOTE — Progress Notes (Signed)
Rounding on patient and found patient sitting in chair with severe right-sided chest pain.  Assisted patient back to bed, gave Vicodin as ordered and called MD to make aware of new onset of pain.  MD ordered portable chest xray.  Orders received and carried out.  Will continue to monitor patient.  Schonewitz, Candelaria Stagers 11/23/2011

## 2011-11-23 NOTE — Progress Notes (Addendum)
[address]            [name] [address]          [phone]          Subjective:  Transfer was uneventful, Feels comfortable currently. Not SOB  Objective  Telemetry NSR  Temp:  [97.7 F (36.5 C)-98.8 F (37.1 C)] 97.7 F (36.5 C) (04/15 1527) Pulse Rate:  [75-86] 75  (04/15 1527) Resp:  [20] 20  (04/15 1527) BP: (125-161)/(62-77) 126/64 mmHg (04/15 1527) SpO2:  [94 %-99 %] 99 % (04/15 1527)   Intake/Output Summary (Last 24 hours) at 11/23/11 1545 Last data filed at 11/23/11 0800  Gross per 24 hour  Intake   1580 ml  Output   1000 ml  Net    580 ml       General appearance: alert, cooperative and no distress Heart: regular rate and rhythm Lungs: clear to auscultation bilaterally Abdomen: soft, non-tender; bowel sounds normal; no masses,  no organomegaly Extremities: extremities normal, atraumatic, no cyanosis or edema  Lab Results: No results found for this basename: NA:2,K:2,CL:2,CO2:2,GLUCOSE:2,BUN:2,CREATININE:2,CALCIUM:2,MG:2,PHOS:2 in the last 72 hours No results found for this basename: AST:2,ALT:2,ALKPHOS:2,BILITOT:2,PROT:2,ALBUMIN:2 in the last 72 hours No results found for this basename: LIPASE:2,AMYLASE:2 in the last 72 hours No results found for this basename: WBC:2,NEUTROABS:2,HGB:2,HCT:2,MCV:2,PLT:2 in the last 72 hours No results found for this basename: CKTOTAL:4,CKMB:4,TROPONINI:4 in the last 72 hours No components found with this basename: POCBNP:3 No results found for this basename: DDIMER in the last 72 hours No results found for this basename: HGBA1C in the last 72 hours No results found for this basename: CHOL,HDL,LDLCALC,TRIG,CHOLHDL in the last 72 hours No results found for this basename: TSH,T4TOTAL,FREET3,T3FREE,THYROIDAB in the last 72 hours No results found for this basename: VITAMINB12,FOLATE,FERRITIN,TIBC,IRON,RETICCTPCT in the last 72 hours  Medications: Scheduled    . allopurinol  300 mg  Oral Daily  . amLODipine  10 mg Oral Daily  . antiseptic oral rinse  15 mL Mouth Rinse BID  . carvedilol  6.25 mg Oral BID WC  . chlorthalidone  25 mg Oral Daily  . enoxaparin  40 mg Subcutaneous Daily  . gemfibrozil  600 mg Oral BID AC  . losartan  100 mg Oral Daily  . lovastatin  10 mg Oral q1800  . pantoprazole (PROTONIX) IV  40 mg Intravenous QHS  . sodium chloride      . DISCONTD: aspirin  325 mg Oral Daily     Radiology/Studies:  Dg Chest Portable 1 View  11/23/2011  *RADIOLOGY REPORT*  Clinical Data: Chest pain  PORTABLE CHEST - 1 VIEW  Comparison: 11/21/2011  Findings: Tiny right apical pneumothorax, unchanged.  Indwelling right chest tube.  Multifocal patchy bilateral opacities, lower lobe predominant, unchanged.  The heart is normal in size.  IMPRESSION: Tiny right apical pneumothorax, unchanged.  Indwelling right chest tube.  Original Report Authenticated By: Charline Bills, M.D.    Chest tube: + air leak  INR: Will add last result for INR, ABG once components are confirmed Will add last 4 CBG results once components are confirmed  Assessment/Plan: He is stable with chest tube in place, persistent air leak. Continue to 20cm H2O suction. May need CT scan of chest and possible VATS.  LOS: 5 days    [name],[name] 4/15/20133:45 PM    I have seen and examined the patient and agree with the assessment and  plan as outlined.  Charles [name] H 11/23/2011 6:16 PM

## 2011-11-23 NOTE — Progress Notes (Signed)
  Subjective: Patient denies any shortness of breath.  Objective: Vital signs in last 24 hours: Temp:  [98 F (36.7 C)-98.8 F (37.1 C)] 98.4 F (36.9 C) (04/15 0525) Pulse Rate:  [77-86] 77  (04/15 0525) Resp:  [20] 20  (04/15 0525) BP: (130-161)/(62-77) 130/62 mmHg (04/15 0525) SpO2:  [93 %-97 %] 94 % (04/15 0525) Last BM Date: 11/22/11  Intake/Output from previous day: 04/14 0701 - 04/15 0700 In: 1820 [P.O.:720; I.V.:1100] Out: 603 [Urine:602; Stool:1] Intake/Output this shift:    General appearance: alert and cooperative Resp: Clear to auscultation with equal breath sounds bilaterally. Right-sided chest tube with ongoing air leak. Cardio: regular rate and rhythm, S1, S2 normal, no murmur, click, rub or gallop  Lab Results:  No results found for this basename: WBC:2,HGB:2,HCT:2,PLT:2 in the last 72 hours BMET No results found for this basename: NA:2,K:2,CL:2,CO2:2,GLUCOSE:2,BUN:2,CREATININE:2,CALCIUM:2 in the last 72 hours PT/INR No results found for this basename: LABPROT:2,INR:2 in the last 72 hours ABG No results found for this basename: PHART:2,PCO2:2,PO2:2,HCO3:2 in the last 72 hours  Studies/Results: Dg Chest Port 1 View  11/21/2011  *RADIOLOGY REPORT*  Clinical Data: Follow-up right pneumothorax  PORTABLE CHEST - 1 VIEW  Comparison: 11/20/2011  Findings: A tiny right apical pneumothorax is again noted and relatively unchanged. Right thoracostomy tube is again identified. Bilateral airspace opacities have slightly improved. Small amount of subcutaneous emphysema on the right is again noted. The cardiomediastinal silhouette is stable.  IMPRESSION: Stable tiny right apical pneumothorax and right thoracostomy tube.  Improved bilateral airspace opacities.  Original Report Authenticated By: Rosendo Gros, M.D.    Anti-infectives: Anti-infectives    None      Assessment/Plan: Impression: Right spontaneous pneumothorax with ongoing air leak. Plan: Have discussed  this case with Dr. Cornelius Moras of thoracic surgery. He is accepted the patient in transfer for further evaluation treatment by cardiothoracic surgery. This is not available at [facility]. The patient has been made aware of the need for transfer and agrees to the transfer.  LOS: 5 days    Charles [name] A 11/23/2011

## 2011-11-23 NOTE — Progress Notes (Signed)
   CARE MANAGEMENT NOTE 11/23/2011  Patient:  PRANEETH, BUSSEY   Account Number:  [id number]  Date Initiated:  11/19/2011  Documentation initiated by:  Sharrie Rothman  Subjective/Objective Assessment:   Pt admitted from home with pneumothorax. Pt lives with wife and is indepedent with ADL's PTA.     Action/Plan:   No CM needs noted.   Anticipated DC Date:  11/25/2011   Anticipated DC Plan:  HOME/SELF CARE      DC Planning Services  CM consult      Choice offered to / List presented to:             Status of service:  In process, will continue to follow Medicare Important Message given?   (If response is "NO", the following Medicare IM given date fields will be blank) Date Medicare IM given:   Date Additional Medicare IM given:    Discharge Disposition:  HOME/SELF CARE  Per UR Regulation:    If discussed at Long Length of Stay Meetings, dates discussed:    Comments:  11/23/11 1316 Arlyss Queen, RN BSN CM Pt is transfering to [facility] to services of Dr. Cornelius Moras. No CM needs at this time.  11/19/11 1400 Arlyss Queen, RN BSN CM

## 2011-11-23 NOTE — Progress Notes (Signed)
Patient resting in bed in no acute distress.  Awaiting results of chest xray and [facility] to arrive for transport.  Schonewitz, Candelaria Stagers  11/23/2011

## 2011-11-23 NOTE — Consult Note (Signed)
CARDIOTHORACIC SURGERY CONSULTATION REPORT  PCP is Carylon Perches, MD, MD Referring Provider is Dalia Heading, MD   Reason for consultation:  Spontaneous pneumothorax with prolonged air leak  HPI:  Patient is a 78 year old gentleman with remote history of tobacco abuse was otherwise in his usual state of good health until 11/18/2011 which time he presented to the emergency department at [facility] with large right spontaneous pneumothorax. A small bore chest tube was placed, facilitating reexpansion of his right lung.  This tube apparently became kinked causing the lung to collapse again, and a larger tube was placed on 11/20/2011.  Since then he has been noted to have persistent air leak despite what appears to be appropriate position of the chest tube. He has been transferred for thoracic surgical consultation.  Past Medical History  Diagnosis Date  . Hypertension   . Hypercholesteremia   . Spontaneous pneumothorax 11/23/2011    Right Side    Past Surgical History  Procedure Date  . Tonsillectomy   . Chest tube insertion 11/20/2011    History reviewed. No pertinent family history.  Social History History  Substance Use Topics  . Smoking status: Former Smoker -- 22 years    Types: Cigarettes    Quit date: 11/18/1983  . Smokeless tobacco: Not on file  . Alcohol Use: 6.0 oz/week    10 Cans of beer per week     heavy    Prior to Admission medications   Medication Sig Start Date End Date Taking? Authorizing Provider  allopurinol (ZYLOPRIM) 300 MG tablet Take 300 mg by mouth daily.   Yes Historical Provider, MD  amLODipine (NORVASC) 10 MG tablet Take 10 mg by mouth daily.   Yes Historical Provider, MD  aspirin 325 MG tablet Take 325 mg by mouth daily.   Yes Historical Provider, MD  carvedilol (COREG) 6.25 MG tablet Take 6.25 mg by mouth 2 (two) times daily with a meal.   Yes Historical Provider, MD  chlorthalidone (HYGROTON) 25 MG tablet Take 25 mg by mouth daily.   Yes  Historical Provider, MD  gemfibrozil (LOPID) 600 MG tablet Take 600 mg by mouth 2 (two) times daily before a meal.   Yes Historical Provider, MD  losartan (COZAAR) 100 MG tablet Take 100 mg by mouth daily.   Yes Historical Provider, MD  lovastatin (MEVACOR) 10 MG tablet Take 10 mg by mouth at bedtime.   Yes Historical Provider, MD    Current Facility-Administered Medications  Medication Dose Route Frequency Provider Last Rate Last Dose  . 0.9 %  sodium chloride infusion   Intravenous Continuous Purcell Nails, MD 10 mL/hr at 11/23/11 1554 10 mL/hr at 11/23/11 1554  . albuterol (PROVENTIL) (5 MG/ML) 0.5% nebulizer solution 2.5 mg  2.5 mg Nebulization Q2H PRN Fabio Bering, MD   2.5 mg at 11/20/11 0218  . allopurinol (ZYLOPRIM) tablet 300 mg  300 mg Oral Daily Fabio Bering, MD   300 mg at 11/22/11 1000  . amLODipine (NORVASC) tablet 10 mg  10 mg Oral Daily Fabio Bering, MD   10 mg at 11/22/11 1000  . antiseptic oral rinse (BIOTENE) solution 15 mL  15 mL Mouth Rinse BID Fabio Bering, MD   15 mL at 11/22/11 0832  . carvedilol (COREG) tablet 6.25 mg  6.25 mg Oral BID WC Fabio Bering, MD   6.25 mg at 11/22/11 0800  . chlorthalidone (HYGROTON) tablet 25 mg  25 mg Oral  Daily Fabio Bering, MD   25 mg at 11/22/11 1000  . enoxaparin (LOVENOX) injection 40 mg  40 mg Subcutaneous Daily Fabio Bering, MD   40 mg at 11/22/11 1246  . gemfibrozil (LOPID) tablet 600 mg  600 mg Oral BID AC Fabio Bering, MD   600 mg at 11/22/11 0800  . HYDROcodone-acetaminophen (NORCO) 5-325 MG per tablet 1-2 tablet  1-2 tablet Oral Q4H PRN Fabio Bering, MD   2 tablet at 11/23/11 1255  . losartan (COZAAR) tablet 100 mg  100 mg Oral QHS Purcell Nails, MD      . lovastatin (MEVACOR) tablet 10 mg  10 mg Oral q1800 Fabio Bering, MD      . nitroGLYCERIN (NITROSTAT) SL tablet 0.4 mg  0.4 mg Sublingual Q5 min PRN Shelda Jakes, MD      . ondansetron [facility]) injection 4 mg  4 mg Intravenous Q6H PRN  Fabio Bering, MD      . pantoprazole (PROTONIX) injection 40 mg  40 mg Intravenous QHS Fabio Bering, MD   40 mg at 11/20/11 2142  . DISCONTD: aspirin tablet 325 mg  325 mg Oral Daily Fabio Bering, MD   325 mg at 11/22/11 1000  . DISCONTD: HYDROmorphone (DILAUDID) injection 0.5 mg  0.5 mg Intravenous Q3H PRN Dalia Heading, MD      . DISCONTD: losartan (COZAAR) tablet 100 mg  100 mg Oral Daily Fabio Bering, MD        No Known Allergies  Review of Systems:  General:  normal appetite, normal energy   Respiratory:  no cough, no wheezing, no hemoptysis, + pain with inspiration or cough, no shortness of breath   Cardiac:   no chest pain or tightness other than that associated with chest tube and pneumothorax, no exertional SOB PTA, no resting SOB, no PND, no orthopnea, no LE edema, no palpitations, no syncope  GI:   no difficulty swallowing, no hematochezia, no hematemesis, no melena, no constipation, no diarrhea   GU:   no dysuria, no urgency, no frequency   Musculoskeletal: no arthritis, + arthralgia both knees since admission  Vascular:  no pain suggestive of claudication   Neuro:   no symptoms suggestive of TIA's, no seizures, no headaches, no peripheral neuropathy   Endocrine:  Negative   HEENT:  no loose teeth or painful teeth,  no recent vision changes  Psych:   no anxiety, no depression    Physical Exam:   BP 126/64  Pulse 75  Temp(Src) 97.7 F (36.5 C) (Oral)  Resp 20  Ht 5\' 11"  (1.803 m)  Wt 91.627 kg (202 lb)  BMI 28.17 kg/m2  SpO2 99%  General:    well-appearing  HEENT:  Unremarkable   Neck:   no JVD, no bruits, no adenopathy   Chest:   clear to auscultation, symmetrical breath sounds, no wheezes, no rhonchi, chest tube in place with + air leak and normal respiratory excursion  CV:   RRR, no  murmur   Abdomen:  soft, non-tender, no masses   Extremities:  warm, well-perfused, pulses diminished  Rectal/GU  Deferred  Neuro:   Grossly non-focal and symmetrical  throughout  Skin:   Clean and dry, no rashes, no breakdown  Diagnostic Tests:  *RADIOLOGY REPORT*  Clinical Data: Follow-up right pneumothorax  PORTABLE CHEST - 1 VIEW 11/20/2013 Comparison: 11/20/2011  Findings: A tiny right apical pneumothorax is again noted and  relatively unchanged.  Right thoracostomy tube is again identified.  Bilateral airspace opacities have slightly improved.  Small amount of subcutaneous emphysema on the right is again noted.  The cardiomediastinal silhouette is stable.  IMPRESSION:  Stable tiny right apical pneumothorax and right thoracostomy tube.  Improved bilateral airspace opacities.  Original Report Authenticated By: Rosendo Gros, M.D.  Repeat portable CXR this morning reviewed but not officially reported looks similar   Impression:  Right spontaneous pneumothorax with adequate reexpansion of the right lung following chest tube placement but significant persistent air leak. Patient has remote history of tobacco abuse and might have some degree of underlying COPD. The patient otherwise appears to be remarkably healthy and physically active for his age with relatively few comorbidities. I suspect that proceeding with video assisted thoracoscopy will be the most expeditious means to treat this problem.   Plan:  We will obtain chest CT scan without contrast this afternoon to make certain that the existing chest tube is in good position and to look for underlying blebs in the lung parenchyma. We will then see if his lung collapses again when suction is discontinued and tentatively plan for surgery tomorrow afternoon. I discussed matters at length with the patient, his wife, and his daughter at the bedside. We discussed the indications, risks, and potential benefits of surgical intervention and all their questions been addressed. The patient hopes to avoid surgery if possible but he understands that it may be the most prudent course of therapy at this time.  All of his questions been addressed.    Salvatore Decent. Cornelius Moras, MD 11/23/2011 5:59 PM

## 2011-11-24 ENCOUNTER — Inpatient Hospital Stay (HOSPITAL_COMMUNITY): Payer: Medicare HMO

## 2011-11-24 LAB — SURGICAL PCR SCREEN: Staphylococcus aureus: POSITIVE — AB

## 2011-11-24 MED ORDER — MORPHINE SULFATE 2 MG/ML IJ SOLN: 2.0000 mg | INTRAMUSCULAR | Status: DC | PRN

## 2011-11-24 MED ORDER — PANTOPRAZOLE SODIUM 40 MG PO TBEC
40.0000 mg | DELAYED_RELEASE_TABLET | Freq: Every day | ORAL | Status: DC
  Administered 2011-11-24 – 2011-12-01 (×8): 40 mg via ORAL
  Filled 2011-11-24 (×8): qty 1

## 2011-11-24 MED ORDER — SODIUM CHLORIDE 0.9 % IV SOLN
1500.0000 mg | Freq: Once | INTRAVENOUS | Status: AC
  Administered 2011-11-25: 1500 mg via INTRAVENOUS
  Filled 2011-11-24: qty 1500

## 2011-11-24 MED ORDER — DEXTROSE 5 % IV SOLN
1.5000 g | Freq: Once | INTRAVENOUS | Status: AC
  Administered 2011-11-25: 1.5 g via INTRAVENOUS
  Filled 2011-11-24 (×3): qty 1.5

## 2011-11-24 NOTE — Progress Notes (Addendum)
   CARDIOTHORACIC SURGERY PROGRESS NOTE  Subjective: No complaints.  Objective: Vital signs in last 24 hours: Temp:  [97.7 F (36.5 C)-98.8 F (37.1 C)] 98 F (36.7 C) (04/16 0449) Pulse Rate:  [70-82] 70  (04/16 0449) Cardiac Rhythm:  [-] Heart block (04/15 2030) Resp:  [20] 20  (04/16 0449) BP: (125-153)/(64-68) 153/68 mmHg (04/16 0449) SpO2:  [95 %-99 %] 97 % (04/16 0449)  Physical Exam:  Rhythm:   sinus  Breath sounds: clear  Heart sounds:  RRR  Incisions:  n/a  Abdomen:  soft  Extremities:  warm  Chest tube(s):  + air leak   Intake/Output from previous day: 04/15 0701 - 04/16 0700 In: 240 [P.O.:240] Out: 1200 [Urine:1200] Intake/Output this shift:    Lab Results: No results found for this basename: WBC:2,HGB:2,HCT:2,PLT:2 in the last 72 hours BMET: No results found for this basename: NA:2,K:2,CL:2,CO2:2,GLUCOSE:2,BUN:2,CREATININE:2,CALCIUM:2 in the last 72 hours  CBG (last 3)  No results found for this basename: GLUCAP:3 in the last 72 hours  CXR:  Some increase in small right PTX with tube on H2O seal this morning  *RADIOLOGY REPORT*  Clinical Data: Follow-up pneumothorax and chest tube position.  Rule out blebs.  CT CHEST WITHOUT CONTRAST 11/23/2011 Technique: Multidetector CT imaging of the chest was performed  following the standard protocol without IV contrast.  Comparison: Chest x-ray 11/23/2011  Findings: The patient has an anterior chest tube. There is a small  right pneumothorax, larger at the right lung base with the patient  in supine position. The visceral pleura on the right is thickened  and there are scattered superficial blebs located laterally and at  the apex. There are scattered parenchymal changes throughout the  lungs bilaterally, indicative of fibrosis.  Heart size is normal. There are coronary artery calcifications.  The visualized portion of the thyroid gland has a normal  appearance. No mediastinal, hilar, or axillary adenopathy.   IMPRESSION:  1. Right pneumothorax and right chest tube.  2. Right apical and lateral blebs.  3. Pulmonary fibrosis.  Original Report Authenticated By: Patterson Hammersmith, M.D.   Assessment/Plan:  Right spontaneous PTX with chest tube in good position, PTX increased with tube on H2O seal overnight.  Will replace tube to suction and tentatively plan right VATS tomorrow.  Hadar Elgersma H 11/24/2011 7:37 AM   I spent in excess of 30 minutes reviewing the indications, risks and potential benefits of VATS for bleb resection and pleurodesis with Mr Gleed and his wife.  All questions addressed.  For OR tomorrow.  Sudeep Scheibel H 11/24/2011 1:46 PM

## 2011-11-24 NOTE — Progress Notes (Signed)
Changed pt's occlusive dressing around chest tube on left lateral chest. Pt tolerated well. Applied occlusive dressing (vasaline gauze) with 4x4 gauze

## 2011-11-25 ENCOUNTER — Inpatient Hospital Stay (HOSPITAL_COMMUNITY): Payer: Medicare HMO | Admitting: Anesthesiology

## 2011-11-25 ENCOUNTER — Inpatient Hospital Stay (HOSPITAL_COMMUNITY): Payer: Medicare HMO

## 2011-11-25 ENCOUNTER — Encounter (HOSPITAL_COMMUNITY): Payer: Self-pay | Admitting: Anesthesiology

## 2011-11-25 ENCOUNTER — Encounter (HOSPITAL_COMMUNITY)
Admission: EM | Disposition: A | Discharge status: Home / Self Care | Payer: Self-pay | Source: Home / Self Care | Attending: Thoracic Surgery (Cardiothoracic Vascular Surgery)

## 2011-11-25 DIAGNOSIS — J930 Spontaneous tension pneumothorax: Secondary | ICD-10-CM

## 2011-11-25 HISTORY — PX: VIDEO ASSISTED THORACOSCOPY: SHX5073

## 2011-11-25 LAB — PREPARE RBC (CROSSMATCH)

## 2011-11-25 LAB — ABO/RH: ABO/RH(D): O POS

## 2011-11-25 SURGERY — VIDEO ASSISTED THORACOSCOPY
Anesthesia: General | Site: Chest | Laterality: Right

## 2011-11-25 MED ORDER — CARVEDILOL 6.25 MG PO TABS
6.2500 mg | ORAL_TABLET | Freq: Two times a day (BID) | ORAL | Status: DC
  Administered 2011-11-25 – 2011-12-01 (×12): 6.25 mg via ORAL
  Filled 2011-11-25 (×14): qty 1

## 2011-11-25 MED ORDER — VANCOMYCIN HCL IN DEXTROSE 1-5 GM/200ML-% IV SOLN
1000.0000 mg | Freq: Two times a day (BID) | INTRAVENOUS | Status: AC
  Administered 2011-11-25: 1000 mg via INTRAVENOUS
  Filled 2011-11-25: qty 200

## 2011-11-25 MED ORDER — FENTANYL CITRATE 0.05 MG/ML IJ SOLN: 50.0000 ug | INTRAMUSCULAR | Status: DC | PRN

## 2011-11-25 MED ORDER — FENTANYL CITRATE 0.05 MG/ML IJ SOLN
INTRAMUSCULAR | Status: DC | PRN
  Administered 2011-11-25 (×3): 50 ug via INTRAVENOUS
  Administered 2011-11-25: 100 ug via INTRAVENOUS

## 2011-11-25 MED ORDER — OXYCODONE HCL 5 MG PO TABS: 5.0000 mg | ORAL_TABLET | ORAL | Status: DC | PRN

## 2011-11-25 MED ORDER — CHLORHEXIDINE GLUCONATE CLOTH 2 % EX PADS
6.0000 | MEDICATED_PAD | Freq: Every day | CUTANEOUS | Status: DC
  Administered 2011-11-25: 6 via TOPICAL

## 2011-11-25 MED ORDER — GEMFIBROZIL 600 MG PO TABS
600.0000 mg | ORAL_TABLET | Freq: Two times a day (BID) | ORAL | Status: DC
  Administered 2011-11-26 – 2011-12-01 (×11): 600 mg via ORAL
  Filled 2011-11-25 (×13): qty 1

## 2011-11-25 MED ORDER — KCL IN DEXTROSE-NACL 20-5-0.45 MEQ/L-%-% IV SOLN
INTRAVENOUS | Status: DC
  Administered 2011-11-25: 75 mL/h via INTRAVENOUS
  Filled 2011-11-25 (×4): qty 1000

## 2011-11-25 MED ORDER — MIDAZOLAM HCL 2 MG/2ML IJ SOLN: 1.0000 mg | INTRAMUSCULAR | Status: DC | PRN

## 2011-11-25 MED ORDER — POTASSIUM CHLORIDE 10 MEQ/50ML IV SOLN: 10.0000 meq | Freq: Every day | INTRAVENOUS | Status: DC | PRN

## 2011-11-25 MED ORDER — ALLOPURINOL 300 MG PO TABS
300.0000 mg | ORAL_TABLET | Freq: Every day | ORAL | Status: DC
  Administered 2011-11-26 – 2011-11-30 (×5): 300 mg via ORAL
  Filled 2011-11-25 (×5): qty 1

## 2011-11-25 MED ORDER — ONDANSETRON HCL 4 MG/2ML IJ SOLN
INTRAMUSCULAR | Status: DC | PRN
  Administered 2011-11-25: 4 mg via INTRAVENOUS

## 2011-11-25 MED ORDER — MUPIROCIN 2 % EX OINT
1.0000 "application " | TOPICAL_OINTMENT | Freq: Two times a day (BID) | CUTANEOUS | Status: AC
  Administered 2011-11-25 – 2011-11-29 (×10): 1 via NASAL
  Filled 2011-11-25: qty 22

## 2011-11-25 MED ORDER — GLYCOPYRROLATE 0.2 MG/ML IJ SOLN
INTRAMUSCULAR | Status: DC | PRN
  Administered 2011-11-25: 0.4 mg via INTRAVENOUS

## 2011-11-25 MED ORDER — LACTATED RINGERS IV SOLN
INTRAVENOUS | Status: DC | PRN
  Administered 2011-11-25 (×2): via INTRAVENOUS

## 2011-11-25 MED ORDER — BISACODYL 5 MG PO TBEC
10.0000 mg | DELAYED_RELEASE_TABLET | Freq: Every day | ORAL | Status: DC
  Administered 2011-11-26 – 2011-12-01 (×4): 10 mg via ORAL
  Filled 2011-11-25 (×4): qty 2
  Filled 2011-11-25: qty 1

## 2011-11-25 MED ORDER — SUCCINYLCHOLINE CHLORIDE 20 MG/ML IJ SOLN
INTRAMUSCULAR | Status: DC | PRN
  Administered 2011-11-25: 140 mg via INTRAVENOUS

## 2011-11-25 MED ORDER — CHLORTHALIDONE 25 MG PO TABS
25.0000 mg | ORAL_TABLET | Freq: Every day | ORAL | Status: DC
  Administered 2011-11-26 – 2011-11-30 (×5): 25 mg via ORAL
  Filled 2011-11-25 (×5): qty 1

## 2011-11-25 MED ORDER — MORPHINE SULFATE 2 MG/ML IJ SOLN
2.0000 mg | INTRAMUSCULAR | Status: DC | PRN
  Administered 2011-11-26: 2 mg via INTRAVENOUS
  Filled 2011-11-25: qty 1

## 2011-11-25 MED ORDER — LIDOCAINE HCL (CARDIAC) 20 MG/ML IV SOLN
INTRAVENOUS | Status: DC | PRN
  Administered 2011-11-25: 100 mg via INTRAVENOUS

## 2011-11-25 MED ORDER — ACETAMINOPHEN 10 MG/ML IV SOLN
1000.0000 mg | Freq: Four times a day (QID) | INTRAVENOUS | Status: AC
  Administered 2011-11-25 – 2011-11-26 (×2): 1000 mg via INTRAVENOUS
  Filled 2011-11-25 (×5): qty 100

## 2011-11-25 MED ORDER — LORAZEPAM 2 MG/ML IJ SOLN: 1.0000 mg | Freq: Once | INTRAMUSCULAR | Status: DC | PRN

## 2011-11-25 MED ORDER — LOSARTAN POTASSIUM 50 MG PO TABS
100.0000 mg | ORAL_TABLET | Freq: Every day | ORAL | Status: DC
  Administered 2011-11-26 – 2011-11-28 (×2): 100 mg via ORAL
  Filled 2011-11-25 (×3): qty 2

## 2011-11-25 MED ORDER — ONDANSETRON HCL 4 MG/2ML IJ SOLN: 4.0000 mg | Freq: Four times a day (QID) | INTRAMUSCULAR | Status: DC | PRN

## 2011-11-25 MED ORDER — OXYCODONE-ACETAMINOPHEN 5-325 MG PO TABS
1.0000 | ORAL_TABLET | ORAL | Status: DC | PRN
  Filled 2011-11-25: qty 1
  Filled 2011-11-25: qty 2

## 2011-11-25 MED ORDER — PHENYLEPHRINE HCL 10 MG/ML IJ SOLN
10.0000 mg | INTRAVENOUS | Status: DC | PRN
  Administered 2011-11-25: 10 ug/min via INTRAVENOUS

## 2011-11-25 MED ORDER — HYDROMORPHONE HCL PF 1 MG/ML IJ SOLN
0.2500 mg | INTRAMUSCULAR | Status: DC | PRN
  Administered 2011-11-25 (×2): 0.5 mg via INTRAVENOUS

## 2011-11-25 MED ORDER — PROPOFOL 10 MG/ML IV EMUL
INTRAVENOUS | Status: DC | PRN
  Administered 2011-11-25: 120 mg via INTRAVENOUS
  Administered 2011-11-25: 50 mg via INTRAVENOUS

## 2011-11-25 MED ORDER — MIDAZOLAM HCL 5 MG/5ML IJ SOLN
INTRAMUSCULAR | Status: DC | PRN
  Administered 2011-11-25: 2 mg via INTRAVENOUS

## 2011-11-25 MED ORDER — ASPIRIN 325 MG PO TABS
325.0000 mg | ORAL_TABLET | Freq: Every day | ORAL | Status: DC
  Administered 2011-11-26 – 2011-12-01 (×6): 325 mg via ORAL
  Filled 2011-11-25 (×6): qty 1

## 2011-11-25 MED ORDER — DEXTROSE 5 % IV SOLN
1.5000 g | Freq: Two times a day (BID) | INTRAVENOUS | Status: AC
  Administered 2011-11-25 – 2011-11-26 (×2): 1.5 g via INTRAVENOUS
  Filled 2011-11-25 (×2): qty 1.5

## 2011-11-25 MED ORDER — SENNOSIDES-DOCUSATE SODIUM 8.6-50 MG PO TABS
1.0000 | ORAL_TABLET | Freq: Every evening | ORAL | Status: DC | PRN
  Filled 2011-11-25: qty 1

## 2011-11-25 MED ORDER — 0.9 % SODIUM CHLORIDE (POUR BTL) OPTIME
TOPICAL | Status: DC | PRN
  Administered 2011-11-25: 2000 mL

## 2011-11-25 MED ORDER — ROCURONIUM BROMIDE 100 MG/10ML IV SOLN
INTRAVENOUS | Status: DC | PRN
  Administered 2011-11-25: 10 mg via INTRAVENOUS
  Administered 2011-11-25: 40 mg via INTRAVENOUS

## 2011-11-25 MED ORDER — AMLODIPINE BESYLATE 10 MG PO TABS
10.0000 mg | ORAL_TABLET | Freq: Every day | ORAL | Status: DC
  Administered 2011-11-26 – 2011-11-29 (×3): 10 mg via ORAL
  Filled 2011-11-25 (×5): qty 1

## 2011-11-25 MED ORDER — OXYCODONE-ACETAMINOPHEN 5-325 MG PO TABS
1.0000 | ORAL_TABLET | ORAL | Status: DC | PRN
  Administered 2011-11-26 (×3): 1 via ORAL

## 2011-11-25 MED ORDER — NEOSTIGMINE METHYLSULFATE 1 MG/ML IJ SOLN
INTRAMUSCULAR | Status: DC | PRN
  Administered 2011-11-25: 3 mg via INTRAVENOUS

## 2011-11-25 SURGICAL SUPPLY — 64 items
BAG DECANTER FOR FLEXI CONT (MISCELLANEOUS) IMPLANT
CANISTER SUCTION 2500CC (MISCELLANEOUS) ×2 IMPLANT
CATH KIT ON Q 5IN SLV (PAIN MANAGEMENT) ×2 IMPLANT
CATH THORACIC 28FR (CATHETERS) IMPLANT
CATH THORACIC 28FR RT ANG (CATHETERS) ×4 IMPLANT
CATH THORACIC 36FR (CATHETERS) IMPLANT
CATH THORACIC 36FR RT ANG (CATHETERS) IMPLANT
CHERRY SPONGEY 1/2 (GAUZE/BANDAGES/DRESSINGS) IMPLANT
CLOTH BEACON ORANGE TIMEOUT ST (SAFETY) ×2 IMPLANT
CONT SPEC 4OZ CLIKSEAL STRL BL (MISCELLANEOUS) ×4 IMPLANT
COVER SURGICAL LIGHT HANDLE (MISCELLANEOUS) ×4 IMPLANT
DERMABOND ADHESIVE PROPEN (GAUZE/BANDAGES/DRESSINGS) ×1
DERMABOND ADVANCED .7 DNX6 (GAUZE/BANDAGES/DRESSINGS) ×1 IMPLANT
DRAPE LAPAROSCOPIC ABDOMINAL (DRAPES) ×2 IMPLANT
DRAPE WARM FLUID 44X44 (DRAPE) ×2 IMPLANT
ELECT REM PT RETURN 9FT ADLT (ELECTROSURGICAL) ×2
ELECTRODE REM PT RTRN 9FT ADLT (ELECTROSURGICAL) ×1 IMPLANT
GLOVE ORTHO TXT STRL SZ7.5 (GLOVE) ×4 IMPLANT
GOWN STRL NON-REIN LRG LVL3 (GOWN DISPOSABLE) ×4 IMPLANT
HANDLE STAPLE ENDO GIA SHORT (STAPLE) ×1
HEMOSTAT SURGICEL 2X14 (HEMOSTASIS) IMPLANT
KIT BASIN OR (CUSTOM PROCEDURE TRAY) ×2 IMPLANT
KIT ROOM TURNOVER OR (KITS) ×2 IMPLANT
KIT SUCTION CATH 14FR (SUCTIONS) ×2 IMPLANT
NS IRRIG 1000ML POUR BTL (IV SOLUTION) ×4 IMPLANT
PACK CHEST (CUSTOM PROCEDURE TRAY) ×2 IMPLANT
PAD ARMBOARD 7.5X6 YLW CONV (MISCELLANEOUS) ×4 IMPLANT
RELOAD EGIA 45 MED/THCK PURPLE (STAPLE) ×4 IMPLANT
RELOAD EGIA 60 MED/THCK PURPLE (STAPLE) ×4 IMPLANT
SEALANT SURG COSEAL 4ML (VASCULAR PRODUCTS) IMPLANT
SOLUTION ANTI FOG 6CC (MISCELLANEOUS) ×2 IMPLANT
SPECIMEN JAR LG PLASTIC EMPTY (MISCELLANEOUS) IMPLANT
SPECIMEN JAR MEDIUM (MISCELLANEOUS) ×2 IMPLANT
SPONGE GAUZE 4X4 12PLY (GAUZE/BANDAGES/DRESSINGS) ×6 IMPLANT
STAPLER ENDO GIA 12MM SHORT (STAPLE) ×1 IMPLANT
STAPLER VISISTAT 35W (STAPLE) IMPLANT
SUT MNCRL AB 3-0 PS2 18 (SUTURE) ×2 IMPLANT
SUT PROLENE 3 0 SH DA (SUTURE) IMPLANT
SUT PROLENE 4 0 RB 1 (SUTURE)
SUT PROLENE 4-0 RB1 .5 CRCL 36 (SUTURE) IMPLANT
SUT SILK  1 MH (SUTURE) ×3
SUT SILK 1 MH (SUTURE) ×3 IMPLANT
SUT SILK 2 0SH CR/8 30 (SUTURE) IMPLANT
SUT SILK 3 0SH CR/8 30 (SUTURE) IMPLANT
SUT VIC AB 2-0 CT1 27 (SUTURE)
SUT VIC AB 2-0 CT1 TAPERPNT 27 (SUTURE) IMPLANT
SUT VIC AB 2-0 CTX 36 (SUTURE) IMPLANT
SUT VIC AB 3-0 MH 27 (SUTURE) IMPLANT
SUT VIC AB 3-0 SH 27 (SUTURE)
SUT VIC AB 3-0 SH 27X BRD (SUTURE) IMPLANT
SUT VIC AB 3-0 SH 8-18 (SUTURE) ×2 IMPLANT
SUT VICRYL 0 UR6 27IN ABS (SUTURE) ×6 IMPLANT
SUT VICRYL 2 TP 1 (SUTURE) IMPLANT
SWAB COLLECTION DEVICE MRSA (MISCELLANEOUS) IMPLANT
SYSTEM SAHARA CHEST DRAIN ATS (WOUND CARE) ×2 IMPLANT
TAPE CLOTH 4X10 WHT NS (GAUZE/BANDAGES/DRESSINGS) ×2 IMPLANT
TAPE CLOTH SURG 4X10 WHT LF (GAUZE/BANDAGES/DRESSINGS) ×2 IMPLANT
TAPE CLOTH SURG 6X10 WHT LF (GAUZE/BANDAGES/DRESSINGS) ×4 IMPLANT
TOWEL OR 17X24 6PK STRL BLUE (TOWEL DISPOSABLE) ×2 IMPLANT
TOWEL OR 17X26 10 PK STRL BLUE (TOWEL DISPOSABLE) ×4 IMPLANT
TRAP SPECIMEN MUCOUS 40CC (MISCELLANEOUS) IMPLANT
TRAY FOLEY CATH 14FRSI W/METER (CATHETERS) IMPLANT
TUBE ANAEROBIC SPECIMEN COL (MISCELLANEOUS) IMPLANT
WATER STERILE IRR 1000ML POUR (IV SOLUTION) ×4 IMPLANT

## 2011-11-25 NOTE — Anesthesia Procedure Notes (Signed)
Procedure Name: Intubation Date/Time: 11/25/2011 2:43 PM Performed by: Jerilee Hoh Pre-anesthesia Checklist: Patient identified, Emergency Drugs available, Suction available and Patient being monitored Patient Re-evaluated:Patient Re-evaluated prior to inductionOxygen Delivery Method: Circle system utilized Preoxygenation: Pre-oxygenation with 100% oxygen Intubation Type: IV induction Ventilation: Mask ventilation without difficulty Laryngoscope Size: Mac and 4 Grade View: Grade II Tube type: Oral Endobronchial tube: Left, EBT position confirmed by fiberoptic bronchoscope and EBT position confirmed by auscultation and 39 Fr Number of attempts: 1 Airway Equipment and Method: Stylet Placement Confirmation: ETT inserted through vocal cords under direct vision,  positive ETCO2 and breath sounds checked- equal and bilateral Tube secured with: Tape Dental Injury: Teeth and Oropharynx as per pre-operative assessment  Comments: Right side of DLT immediately clamped after intubation and prior to ventilating.

## 2011-11-25 NOTE — Anesthesia Preprocedure Evaluation (Signed)
Anesthesia Evaluation  Patient identified by MRN, date of birth, ID band Patient awake    Reviewed: Allergy & Precautions, H&P , NPO status , Patient's Chart, lab work & pertinent test results  Airway Mallampati: I TM Distance: >3 FB Neck ROM: Full    Dental   Pulmonary COPD spont pneumothorax  pneumothorax on R-no breath sounds        Cardiovascular     Neuro/Psych    GI/Hepatic   Endo/Other    Renal/GU      Musculoskeletal   Abdominal   Peds  Hematology   Anesthesia Other Findings   Reproductive/Obstetrics                           Anesthesia Physical Anesthesia Plan  ASA: III  Anesthesia Plan: General   Post-op Pain Management:    Induction: Intravenous  Airway Management Planned: Double Lumen EBT  Additional Equipment: Arterial line and CVP  Intra-op Plan:   Post-operative Plan: Extubation in OR and Possible Post-op intubation/ventilation  Informed Consent: I have reviewed the patients History and Physical, chart, labs and discussed the procedure including the risks, benefits and alternatives for the proposed anesthesia with the patient or authorized representative who has indicated his/her understanding and acceptance.     Plan Discussed with: CRNA and Surgeon  Anesthesia Plan Comments:         Anesthesia Quick Evaluation

## 2011-11-25 NOTE — Anesthesia Postprocedure Evaluation (Signed)
  Anesthesia Post-op Note  Patient: Charles [name]  Procedure(s) Performed: Procedure(s) (LRB): VIDEO ASSISTED THORACOSCOPY (Right)  Patient Location: PACU  Anesthesia Type: General  Level of Consciousness: awake  Airway and Oxygen Therapy: Patient Spontanous Breathing and Patient connected to nasal cannula oxygen  Post-op Pain: mild  Post-op Assessment: Post-op Vital signs reviewed, Patient's Cardiovascular Status Stable, Respiratory Function Stable and Patent Airway  Post-op Vital Signs: Reviewed and stable  Complications: No apparent anesthesia complications

## 2011-11-25 NOTE — Progress Notes (Signed)
Per report this morning, pt became SOB last pm. Assessed dressing this morning, and the tube was out of the pt's chest. Charles Dandy, PA was on the floor and able to assess and remove tube. Occlusive dressing attached to site. Pt not in distress. PA will notify MD. Pt on schedule for surgery today. Will continue to monitor. Told pt to call if feels SOB.

## 2011-11-25 NOTE — Progress Notes (Signed)
   CARE MANAGEMENT NOTE 11/25/2011  Patient:  Charles [name], Charles [name]   Account Number:  [id number]  Date Initiated:  11/19/2011  Documentation initiated by:  Sharrie Rothman  Subjective/Objective Assessment:   Pt admitted from home with pneumothorax. Pt lives with wife and is indepedent with ADL's PTA.     Action/Plan:   No CM needs noted.   Anticipated DC Date:  11/25/2011   Anticipated DC Plan:  HOME/SELF CARE      DC Planning Services  CM consult      Choice offered to / List presented to:             Status of service:  In process, will continue to follow Medicare Important Message given?   (If response is "NO", the following Medicare IM given date fields will be blank) Date Medicare IM given:   Date Additional Medicare IM given:    Discharge Disposition:  HOME/SELF CARE  Per UR Regulation:    If discussed at Long Length of Stay Meetings, dates discussed:   11/25/2011    Comments:  11/25/11 China Deitrick,RN,BSN VATS TODAY WITH DR Cornelius Moras.  WILL FOLLOW FOR DISCHARGE NEEDS.  11/23/11 1316 Arlyss Queen, RN BSN CM Pt is transfering to [facility] to services of Dr. Cornelius Moras. No CM needs at this time.  11/19/11 1400 Arlyss Queen, RN BSN CM

## 2011-11-25 NOTE — Brief Op Note (Signed)
11/18/2011 - 11/25/2011  3:54 PM  PATIENT:  Charles [name]  78 y.o. male  PRE-OPERATIVE DIAGNOSIS:  RIGHT SPONTANEOUS PNEUMOTHORAX WITH PROLONGED AIR LEAK  POST-OPERATIVE DIAGNOSIS:  same  PROCEDURE:  Procedure(s) (LRB): VIDEO ASSISTED THORACOSCOPY (Right) FOR STAPLING OF BLEBS AND APICAL PLEURECTOMY  SURGEON:    Purcell Nails, MD  ASSISTANTS:  Gershon Crane, PA-C  ANESTHESIA:   Bedelia Person, MD  COMPLICATIONS: none  PATIENT DISPOSITION:   TO SICU IN STABLE CONDITION  Kasiah Manka H 11/25/2011 3:56 PM

## 2011-11-25 NOTE — Progress Notes (Signed)
Patient became very SOB this morning after transferring from chair back to bed.  O2 sats were in the low 80s, had to bump patient up to 4L to improve sats.  Respiratory therapy provided a breathing treatment and patient felt much better.  Patient currently at 90% on 4L.  Placed new petroleum gauze chest tube dressing to site.  Will continue to monitor.  Charles [name]

## 2011-11-25 NOTE — Progress Notes (Addendum)
Subjective:  Charles [name] complains of some gurgling sensation at his chest tube site.  He complained of some shortness of breath in the middle of the night requiring an increase in his oxygenation.  I was asked by his nurse to come into his room because they think his chest tube was out.  I looked at the site and they were correct in that the entire chest tube was out.    Objective: Vital signs in last 24 hours: Temp:  [96.8 F (36 C)-98.7 F (37.1 C)] 98.4 F (36.9 C) (04/17 0600) Pulse Rate:  [70-88] 88  (04/17 0600) Cardiac Rhythm:  [-] Heart block (04/17 0809) Resp:  [18-20] 20  (04/17 0600) BP: (127-160)/(67-80) 160/80 mmHg (04/17 0600) SpO2:  [90 %-98 %] 90 % (04/17 0634)   Intake/Output from previous day: 04/16 0701 - 04/17 0700 In: 960 [P.O.:720; I.V.:240] Out: 1380 [Urine:1300; Chest Tube:80] I   General appearance: alert, cooperative and no distress Heart: regular rate and rhythm Lungs: wheezes bilaterally Abdomen: soft, non-tender; bowel sounds normal; no masses,  no organomegaly Wound: chest tube site closed with suture, chest tube out laying against patients side  Lab Results: No results found for this basename: WBC:2,HGB:2,HCT:2,PLT:2 in the last 72 hours BMET: No results found for this basename: NA:2,K:2,CL:2,CO2:2,GLUCOSE:2,BUN:2,CREATININE:2,CALCIUM:2 in the last 72 hours  PT/INR: No results found for this basename: LABPROT,INR in the last 72 hours ABG No results found for this basename: phart, pco2, po2, hco3, tco2, acidbasedef, o2sat   CBG (last 3)  No results found for this basename: GLUCAP:3 in the last 72 hours  Assessment/Plan:  1.R  Spontaneous Pneumothorax- S/P right sided chest tube placement- on exam this morning chest tube has fallen out 2. Resp- patients oxygen has been increased, he has bilateral wheezing throughout all lung fields, does not appear to be in distress 3. Dispo- patient will need monitored closely, if patient becomes distressed  nurses were instructed to page [place] immediately.  Patient was scheduled for VATS this afternoon and we will proceed as scheduled   LOS: 7 days    Charles, [name] 11/25/2011   I have seen and examined the patient and agree with the assessment and plan as outlined.  Currently breathing stable despite decreased breath sounds on right side and chest tube inadvertently out.  For OR today for right VATS.  Will monitor closely between now and then.  Charles [name] H 11/25/2011 9:04 AM

## 2011-11-25 NOTE — Op Note (Signed)
CARDIOTHORACIC SURGERY OPERATIVE NOTE  Date of procedure:   11/25/2011  Pre-op Diagnosis:     Right Spontaneous Pneumothorax with Prolonged Air Leak  Post-op Diagnosis:     same  Procedure:    Right Video Assisted Thoracoscopy for Stapling of Blebs and Apical Pleurectomy  Surgeon:                    Purcell Nails, MD  Assistant:                Gershon Crane, PA-C   Anesthesia:                 Bedelia Person, MD  Complications:   none     BRIEF CLINICAL NOTE AND INDICATIONS FOR SURGERY  Patient is a 78 year old gentleman with remote history of tobacco abuse was otherwise in his usual state of good health until 11/18/2011 which time he presented to the emergency department at [facility] with large right spontaneous pneumothorax. A small bore chest tube was placed, facilitating reexpansion of his right lung.  This tube apparently became kinked causing the lung to collapse again, and a larger tube was placed on 11/20/2011.  Since then he has been noted to have persistent air leak despite what appears to be appropriate position of the chest tube. He was transferred to [facility] for thoracic surgical consultation and a full consultation note dictated previously.  The patient has been seen in consultation and counseled at length regarding the indications, risks and potential benefits of surgery.  All questions have been answered, and the patient provides full informed consent for the operation as described.     DETAILS OF THE OPERATIVE PROCEDURE  The patient is brought to the operating room on the above-mentioned date. Earlier that morning the patient's pre-existing chest tube had fallen out. The patient remained entirely stable clinically although on physical exam he was noted to have no breath sounds on the right side, consistent with likely recurrence of his right pneumothorax. A radial arterial line and central venous catheter is placed. The patient is placed in the supine position on the operative  table. General endotracheal anesthesia is induced uneventfully. The patient is intubated using a dual-lumen endotracheal tube. A Foley catheter is placed. Pneumatic sequential compression boots are placed on both lower extremities. The patient is turned to the left lateral decubitus position using an axillary roll and pneumatic beanbag device to facilitate positioning. The patient's right chest was prepared and draped in a sterile manner.  A time out procedure is performed. A small incision is made overlying the anterior axillary line in the seventh intercostal space. The incision is completed through the subcutaneous tissues and the intercostal space into the pleural space using electrocautery. A 10 mm port was passed through the incision and a thoracoscopic camera passed through the port. The right chest was explored visually. There is blood clot and some blood located anteriorly in the vicinity of the old chest tube. There is one adhesion in this location with some blood clot adherent to the right middle lobe immediately adjacent to this area. There is an obvious large bleb at the apex of the right upper lobe with appearance suggestive of recent rupture. The remainder of the lung parenchyma appears normal with exception of mild changes of chronic obstructive pulmonary disease. A second port incision is placed posteriorly.  The patient's old chest tube incision located more anteriorly is utilized to facilitate bleb resection and pleurectomy.  A small wedge  section from the right middle lobe was taken after removing the blood clot anteriorly where the old chest tube had been. There is suggestion of possible minor injury to the surface of the lung at this area. A second wedge section is taken from the apex of the right upper lobe including all of the apical blebs. The remainder of the lung was carefully examined and there no other signs of blebs or other significant abnormalities. The parietal pleura is removed  from the anterior and lateral surface of the right chest starting at the apex and extending down two thirds of the way on both sides. The right pleural space is now drained using 228 French chest tubes placed through the anterior and middle port incision. The remaining posterior port incision is closed in multiple layers in routine fashion. The chest tubes are fixed to close suction drainage device.  Patient heart the procedure well, was extubated in the operating room, and transported to postanesthesia care unit in stable condition. There are no intraoperative complications. All sponge instrument and needle counts are verified correct. Estimated blood loss was 50 mL.   Salvatore Decent. Cornelius Moras MD 11/25/2011 4:37 PM

## 2011-11-25 NOTE — Progress Notes (Signed)
Patient ID: Charles [name], male   DOB: 01/20/1933, 78 y.o.   MRN: [id number]  Filed Vitals:   11/25/11 1730 11/25/11 1800 11/25/11 1900 11/25/11 2001  BP:   137/62   Pulse: 78 81 83   Temp:    99.3 F (37.4 C)  TempSrc:    Oral  Resp: 23 19 21    Height:      Weight:      SpO2: 98% 94% 95%    Stable following VATS bleb stapling today.  Sleeping

## 2011-11-25 NOTE — Transfer of Care (Signed)
Immediate Anesthesia Transfer of Care Note  Patient: Charles [name]  Procedure(s) Performed: Procedure(s) (LRB): VIDEO ASSISTED THORACOSCOPY (Right)  Patient Location: PACU  Anesthesia Type: General  Level of Consciousness: awake, alert , pateint uncooperative and confused  Airway & Oxygen Therapy: Patient Spontanous Breathing and Patient connected to face mask oxygen  Post-op Assessment: Report given to PACU RN, Post -op Vital signs reviewed and stable and Patient moving all extremities  Post vital signs: Reviewed and stable  Complications: No apparent anesthesia complications

## 2011-11-25 NOTE — Progress Notes (Signed)
Pt still stable on venti mask 12L 92%. HR 88. Will continue to monitor

## 2011-11-25 NOTE — Progress Notes (Signed)
MD at bedside and pt's O2 85% on 5L [place]. Switched to Venti mask 12L and O2 92%. Pt not in distress. Will continue to monitor. MD ok with condition of pt until surgery today.

## 2011-11-25 NOTE — Preoperative (Signed)
Beta Blockers   Reason not to administer Beta Blockers:Not Applicable 

## 2011-11-26 ENCOUNTER — Inpatient Hospital Stay (HOSPITAL_COMMUNITY): Payer: Medicare HMO

## 2011-11-26 LAB — CBC
HCT: 27 % — ABNORMAL LOW (ref 39.0–52.0)
Hemoglobin: 9.6 g/dL — ABNORMAL LOW (ref 13.0–17.0)
MCH: 35.2 pg — ABNORMAL HIGH (ref 26.0–34.0)
MCHC: 35.6 g/dL (ref 30.0–36.0)

## 2011-11-26 LAB — POCT I-STAT 3, ART BLOOD GAS (G3+)
Acid-Base Excess: 3 mmol/L — ABNORMAL HIGH (ref 0.0–2.0)
Bicarbonate: 27.1 mEq/L — ABNORMAL HIGH (ref 20.0–24.0)
O2 Saturation: 90 %
pO2, Arterial: 56 mmHg — ABNORMAL LOW (ref 80.0–100.0)

## 2011-11-26 LAB — BASIC METABOLIC PANEL
BUN: 24 mg/dL — ABNORMAL HIGH (ref 6–23)
Calcium: 9.2 mg/dL (ref 8.4–10.5)
GFR calc non Af Amer: 85 mL/min — ABNORMAL LOW (ref >90)
Glucose, Bld: 127 mg/dL — ABNORMAL HIGH (ref 70–99)

## 2011-11-26 MED ORDER — OXYCODONE-ACETAMINOPHEN 5-325 MG PO TABS
1.0000 | ORAL_TABLET | ORAL | Status: DC | PRN
  Administered 2011-11-26: 1 via ORAL
  Filled 2011-11-26: qty 1

## 2011-11-26 MED ORDER — TRAMADOL HCL 50 MG PO TABS: 50.0000 mg | ORAL_TABLET | Freq: Four times a day (QID) | ORAL | Status: DC | PRN

## 2011-11-26 NOTE — Progress Notes (Signed)
   CARDIOTHORACIC SURGERY PROGRESS NOTE  1 Day Post-Op  S/P Procedure(s) (LRB): VIDEO ASSISTED THORACOSCOPY (Right)  Subjective: Looks good.  Received 2 oxycodone tablets this morning which made him quite drowsy.  However, he's easily arousable and denies pain.  Breathing comfortably on O2 3 L/min via [place]  Objective: Vital signs in last 24 hours: Temp:  [97.6 F (36.4 C)-99.3 F (37.4 C)] 97.8 F (36.6 C) (04/18 0700) Pulse Rate:  [73-97] 73  (04/18 0700) Cardiac Rhythm:  [-] Normal sinus rhythm (04/18 0750) Resp:  [16-27] 16  (04/18 0700) BP: (103-171)/(44-91) 108/44 mmHg (04/18 0700) SpO2:  [90 %-100 %] 97 % (04/18 0700) Arterial Line BP: (92-180)/(30-61) 92/30 mmHg (04/18 0700) Weight:  [98.9 kg (218 lb 0.6 oz)] 98.9 kg (218 lb 0.6 oz) (04/18 0600)  Physical Exam:  Rhythm:   sinus  Breath sounds: clear  Heart sounds:  RRR  Incisions:  Dressings dry  Abdomen:  soft  Extremities:  warm  Chest tube(s):  No air leak.  Thin serosanguinous output.   Intake/Output from previous day: 04/17 0701 - 04/18 0700 In: 2675 [I.V.:2225; IV Piggyback:450] Out: 2100 [Urine:1710; Blood:50; Chest Tube:340] Intake/Output this shift:    Lab Results:  Basename 11/26/11 0445  WBC 21.7*  HGB 9.6*  HCT 27.0*  PLT 294   BMET:  Basename 11/26/11 0445  NA 130*  K 4.0  CL 94*  CO2 25  GLUCOSE 127*  BUN 24*  CREATININE 0.80  CALCIUM 9.2    CBG (last 3)   Basename 11/25/11 1939  GLUCAP 127*    CXR:  Looks good although some increased opacity right upper lung field  Assessment/Plan: S/P Procedure(s) (LRB): VIDEO ASSISTED THORACOSCOPY (Right)  Doing well POD1 Expected post op acute blood loss anemia, mild, stable No air leak, lung well-expanded   Mobilize  Decrease IV fluids  Watch anemia  No lovenox due to risk of bleeding - SCD's for DVT prophylaxis  D/C a-line  D/C foley  Leave tubes to suction today  Still awaiting bed on 3300 for transfer  [name]  H 11/26/2011 7:57 AM

## 2011-11-26 NOTE — Progress Notes (Signed)
Documented removal of Chest tube LDA this am for tube inserted 4/10.  Removal time unknown to me, but this tube was not in place at all on my shift.

## 2011-11-27 ENCOUNTER — Encounter (HOSPITAL_COMMUNITY): Payer: Self-pay | Admitting: Thoracic Surgery (Cardiothoracic Vascular Surgery)

## 2011-11-27 ENCOUNTER — Inpatient Hospital Stay (HOSPITAL_COMMUNITY): Payer: Medicare HMO

## 2011-11-27 LAB — COMPREHENSIVE METABOLIC PANEL
Alkaline Phosphatase: 112 U/L (ref 39–117)
BUN: 48 mg/dL — ABNORMAL HIGH (ref 6–23)
Calcium: 9.2 mg/dL (ref 8.4–10.5)
GFR calc Af Amer: 29 mL/min — ABNORMAL LOW (ref >90)
GFR calc non Af Amer: 25 mL/min — ABNORMAL LOW (ref >90)
Glucose, Bld: 118 mg/dL — ABNORMAL HIGH (ref 70–99)
Potassium: 4.1 mEq/L (ref 3.5–5.1)
Total Protein: 6.5 g/dL (ref 6.0–8.3)

## 2011-11-27 LAB — CBC
HCT: 23.4 % — ABNORMAL LOW (ref 39.0–52.0)
Hemoglobin: 8.3 g/dL — ABNORMAL LOW (ref 13.0–17.0)
MCH: 35.2 pg — ABNORMAL HIGH (ref 26.0–34.0)
MCHC: 35.5 g/dL (ref 30.0–36.0)

## 2011-11-27 MED ORDER — FUROSEMIDE 10 MG/ML IJ SOLN
INTRAMUSCULAR | Status: AC
  Administered 2011-11-27: 20 mg via INTRAVENOUS
  Filled 2011-11-27: qty 4

## 2011-11-27 MED ORDER — ALBUTEROL SULFATE (5 MG/ML) 0.5% IN NEBU
2.5000 mg | INHALATION_SOLUTION | Freq: Four times a day (QID) | RESPIRATORY_TRACT | Status: DC
  Administered 2011-11-27: 2.5 mg via RESPIRATORY_TRACT
  Filled 2011-11-27: qty 0.5

## 2011-11-27 MED ORDER — ALBUTEROL SULFATE (5 MG/ML) 0.5% IN NEBU: 2.5000 mg | INHALATION_SOLUTION | Freq: Four times a day (QID) | RESPIRATORY_TRACT | Status: DC | PRN

## 2011-11-27 MED ORDER — FUROSEMIDE 10 MG/ML IJ SOLN
20.0000 mg | Freq: Once | INTRAMUSCULAR | Status: AC
  Administered 2011-11-27: 20 mg via INTRAVENOUS
  Filled 2011-11-27: qty 2

## 2011-11-27 NOTE — Progress Notes (Signed)
D/c'd anterior CT per MD order. Tolerated well. No signs of respiratory distress.

## 2011-11-27 NOTE — Progress Notes (Addendum)
2 Days Post-Op Procedure(s) (LRB): VIDEO ASSISTED THORACOSCOPY (Right) Subjective:  Charles [name] complains of dyspnea, he states he doesn't feel his breathing is what it should be  Objective: Vital signs in last 24 hours: Temp:  [97.3 F (36.3 C)-98.6 F (37 C)] 98.6 F (37 C) (04/19 0800) Pulse Rate:  [64-65] 64  (04/19 0425) Cardiac Rhythm:  [-] Heart block (04/19 0425) Resp:  [12-21] 12  (04/19 0425) BP: (91-117)/(28-46) 105/46 mmHg (04/19 0800) SpO2:  [91 %-100 %] 100 % (04/19 0425)  Intake/Output from previous day: 04/18 0701 - 04/19 0700 In: 300 [I.V.:300] Out: 345 [Urine:150; Chest Tube:195]  General appearance: alert, cooperative and no distress Heart: regular rate and rhythm Lungs: CTA on left, diminished throughout right Abdomen: soft, non-tender; bowel sounds normal; no masses,  no organomegaly Wound: clean and dry  Lab Results:  [facility] 11/27/11 0427 11/26/11 0445  WBC 26.2* 21.7*  HGB 8.3* 9.6*  HCT 23.4* 27.0*  PLT 302 294   BMET:  Basename 11/27/11 0427 11/26/11 0445  NA 126* 130*  K 4.1 4.0  CL 88* 94*  CO2 26 25  GLUCOSE 118* 127*  BUN 48* 24*  CREATININE 2.39* 0.80  CALCIUM 9.2 9.2    PT/INR: No results found for this basename: LABPROT,INR in the last 72 hours ABG    Component Value Date/Time   PHART 7.441 11/26/2011 0426   HCO3 27.1* 11/26/2011 0426   TCO2 28 11/26/2011 0426   O2SAT 90.0 11/26/2011 0426   CBG (last 3)   Basename 11/25/11 1939  GLUCAP 127*    Assessment/Plan: S/P Procedure(s) (LRB): VIDEO ASSISTED THORACOSCOPY (Right)  2. Resp- patient on oxygen, feels short of breath, + atelectasis on CXR will start Nebulizer treatments Q6 and continue IS 3. Chest tubes- no air leak present, 190cc of output yesterday, possibly d/c posterior tube 4. Acute post operative blood loss anemia- Hgb is 8.3 this morning, will continue to follow, if decreases any further may benefit from transfusion 5. Ambulate   LOS: 9 days    [name],  ERIN 11/27/2011   I have seen and examined the patient and agree with the assessment and plan as outlined.  Will d/c anterior tube and place posterior tube to H20 seal.  Hyponatremia likely due to H20 excess and volume excess - will give one dose lasix.  Opacity on x-ray likely related to pleurectomy - will follow without adding antibiotics for now.  Idalee Foxworthy H 11/27/2011 1:23 PM

## 2011-11-28 ENCOUNTER — Inpatient Hospital Stay (HOSPITAL_COMMUNITY): Payer: Medicare HMO

## 2011-11-28 LAB — BASIC METABOLIC PANEL
BUN: 60 mg/dL — ABNORMAL HIGH (ref 6–23)
Chloride: 84 mEq/L — ABNORMAL LOW (ref 96–112)
Glucose, Bld: 110 mg/dL — ABNORMAL HIGH (ref 70–99)
Potassium: 4.1 mEq/L (ref 3.5–5.1)
Sodium: 121 mEq/L — ABNORMAL LOW (ref 135–145)

## 2011-11-28 LAB — CBC
HCT: 22.6 % — ABNORMAL LOW (ref 39.0–52.0)
Hemoglobin: 8.2 g/dL — ABNORMAL LOW (ref 13.0–17.0)
RBC: 2.33 MIL/uL — ABNORMAL LOW (ref 4.22–5.81)
WBC: 21.3 10*3/uL — ABNORMAL HIGH (ref 4.0–10.5)

## 2011-11-28 MED ORDER — LACTULOSE 10 GM/15ML PO SOLN
20.0000 g | Freq: Once | ORAL | Status: AC
  Administered 2011-11-28: 20 g via ORAL
  Filled 2011-11-28: qty 30

## 2011-11-28 MED ORDER — FUROSEMIDE 40 MG PO TABS
40.0000 mg | ORAL_TABLET | Freq: Once | ORAL | Status: DC
  Filled 2011-11-28: qty 1

## 2011-11-28 MED ORDER — FUROSEMIDE 10 MG/ML IJ SOLN
40.0000 mg | Freq: Once | INTRAMUSCULAR | Status: AC
Start: 2011-11-28 — End: 2011-11-28
  Administered 2011-11-28: 40 mg via INTRAVENOUS
  Filled 2011-11-28: qty 4

## 2011-11-28 NOTE — Progress Notes (Addendum)
3 Days Post-Op Procedure(s) (LRB): VIDEO ASSISTED THORACOSCOPY (Right)  Subjective: Patient with a productive cough (clear sputum) and constipation.  Objective: Vital signs in last 24 hours: Patient Vitals for the past 24 hrs:  BP Temp Temp src Pulse Resp SpO2  11/28/11 0730 - 98.1 F (36.7 C) Oral 78  18  -  11/28/11 0400 125/51 mmHg 97.6 F (36.4 C) Oral 71  19  100 %  11/27/11 2355 124/47 mmHg 97.8 F (36.6 C) Oral 76  19  96 %  11/27/11 2000 111/58 mmHg 98.2 F (36.8 C) Oral 79  22  93 %  11/27/11 1600 105/46 mmHg 97.4 F (36.3 C) Oral 74  23  95 %  11/27/11 1430 - - - - - 97 %    Current Weight  11/26/11 218 lb 0.6 oz (98.9 kg)      Intake/Output from previous day: 04/19 0701 - 04/20 0700 In: 1180 [P.O.:720; I.V.:460] Out: 1625 [Urine:1525; Chest Tube:100]   Physical Exam:  Cardiovascular: RRR, no murmurs, gallops, or rubs. Pulmonary: Clear to auscultation bilaterally; no rales, wheezes, or rhonchi. Abdomen: Soft, non tender, bowel sounds present. Extremities: Mild bilateral lower extremity edema. Wounds: Clean and dry.  No erythema or signs of infection.  Lab Results: CBC: Basename 11/28/11 0413 11/27/11 0427  WBC 21.3* 26.2*  HGB 8.2* 8.3*  HCT 22.6* 23.4*  PLT 327 302   BMET:  Basename 11/28/11 0413 11/27/11 0427  NA 121* 126*  K 4.1 4.1  CL 84* 88*  CO2 25 26  GLUCOSE 110* 118*  BUN 60* 48*  CREATININE 1.92* 2.39*  CALCIUM 9.1 9.2    PT/INR: No results found for this basename: LABPROT,INR in the last 72 hours ABG:  INR: Will add last result for INR, ABG once components are confirmed Will add last 4 CBG results once components are confirmed  Assessment/Plan:  1.Pulmonary-Chest tube output 100 cc for last 24 hours. There is no air leak. Chest tube is to water seal.There is no air leak. CXR shows no ptx, stable right airspace disease and small pleural effusion.Possibly remove remaining chest tube. Check CXR in am.On 3L of O2 via [place]-wean as  tolerates. 2.Leukocytosis-WBC continues to decrease. Now down to 21,300. 3.Remove On Q. 4.ARI-creatinine decreasing (2.39 to 1.92). Monitor as is on ACE. 5.Hyponatremia likely due to volume excess.Given Lasix yesterday.  ZIMMERMAN,DONIELLE MPA-C 11/28/2011   I have seen and examined the patient and agree with the assessment and plan as outlined.  Will d/c remaining chest tube.  Hold ARB due to acute renal insufficiency.  Hyponatremia worse, likely due to volume excess from acute renal insufficiency - will give lasix again.  Wean O2 as tolerated.  Hazelynn Mckenny H 11/28/2011 12:09 PM

## 2011-11-29 ENCOUNTER — Inpatient Hospital Stay (HOSPITAL_COMMUNITY): Payer: Medicare HMO

## 2011-11-29 LAB — BASIC METABOLIC PANEL
BUN: 67 mg/dL — ABNORMAL HIGH (ref 6–23)
CO2: 26 mEq/L (ref 19–32)
Chloride: 84 mEq/L — ABNORMAL LOW (ref 96–112)
Creatinine, Ser: 1.66 mg/dL — ABNORMAL HIGH (ref 0.50–1.35)
Potassium: 3.7 mEq/L (ref 3.5–5.1)

## 2011-11-29 LAB — TYPE AND SCREEN
Antibody Screen: NEGATIVE
Unit division: 0

## 2011-11-29 LAB — CBC
MCH: 34.6 pg — ABNORMAL HIGH (ref 26.0–34.0)
Platelets: 387 10*3/uL (ref 150–400)
WBC: 20.3 10*3/uL — ABNORMAL HIGH (ref 4.0–10.5)

## 2011-11-29 MED ORDER — POTASSIUM CHLORIDE CRYS ER 10 MEQ PO TBCR
10.0000 meq | EXTENDED_RELEASE_TABLET | Freq: Once | ORAL | Status: AC
  Administered 2011-11-29: 10 meq via ORAL
  Filled 2011-11-29: qty 1

## 2011-11-29 MED ORDER — SODIUM CHLORIDE 0.9 % IJ SOLN
INTRAMUSCULAR | Status: AC
  Administered 2011-11-29: 10 mL
  Filled 2011-11-29: qty 10

## 2011-11-29 MED ORDER — POTASSIUM CHLORIDE CRYS ER 20 MEQ PO TBCR
EXTENDED_RELEASE_TABLET | ORAL | Status: AC
  Filled 2011-11-29: qty 2

## 2011-11-29 MED ORDER — OXYCODONE-ACETAMINOPHEN 5-325 MG PO TABS: 1.0000 | ORAL_TABLET | ORAL | Status: AC | PRN

## 2011-11-29 MED ORDER — FUROSEMIDE 10 MG/ML IJ SOLN
40.0000 mg | Freq: Once | INTRAMUSCULAR | Status: AC
  Administered 2011-11-29: 40 mg via INTRAVENOUS
  Filled 2011-11-29: qty 4

## 2011-11-29 NOTE — Discharge Summary (Addendum)
Physician Discharge Summary  Patient ID: Charles [name] MRN: [id number] DOB/AGE: [birth year] 78 y.o.  Admit date: 11/18/2011 Discharge date: 11/30/2011  Admission Diagnoses: 1. Spontaneous right pneumothorax 2. History of hypertension 3. History of hypercholesterolemia 4. History of remote tobacco abuse  Discharge Diagnoses:  1. Spontaneous right pneumothorax (secondary to blebs) 2. History of hypertension 3. History of hypercholesterolemia 4. History of remote tobacco abuse 5.COPD (emphysema) 6.Acute renal insufficiency   Procedure (s): Right Video Assisted Thoracoscopy for Stapling of Blebs and Apical Pleurectomy By Dr. Cornelius Moras on 11/25/2011.  History of Presenting Illness: This is a 78 year old gentleman with remote history of tobacco abuse who was in his usual state of health until 11/18/2011. At this time, he presented to the emergency department at [facility]. He was found to have a  large right spontaneous pneumothorax. A small bore chest tube was placed, facilitating reexpansion of his right lung. This tube apparently became kinked ,causing the lung to collapse again; a larger tube was placed on 11/20/2011. Since then, he has been noted to have persistent air leak, despite what appears to be appropriate position of the chest tube. He has been accepted in transfer (by Dr. Cornelius Moras) to [facility] hospital for further thoracic evaluation.  Brief Hospital Course:  He remained afebrile and hemodynamically stable. His chest tube was not found have an air leak. Daily chest x-rays were obtained and remained stable. His Foley and A-line were removed on postoperative day #1. His anterior chest tube was removed on 4/19. He was found have acute renal insufficiency postoperatively. His creatinine went as high as 2.39. Renal US showed no hydronephrosis or mass. He was on an ACE inhibitor and Allopurinol which were discontinued.His last creatinine was down to 1.49. His remaining chest tube was then removed on  04/20. He is still requiring 2 L of oxygen via nasal cannula. He will need home O2 upon discharge. He has already been tolerating a diet and has had a bowel movement.He is felt surgically stable for discharge today.   Latest Vital Signs: Blood pressure 114/46, pulse 67, temperature 98.3 F (36.8 C), temperature source Oral, resp. rate 21, height 5\' 11"  (1.803 m), weight 218 lb 0.6 oz (98.9 kg), SpO2 98.00%.  Physical Exam: Cardiovascular: RRR, no murmurs, gallops, or rubs.  Pulmonary:Diminshed at right base; no rales, wheezes, or rhonchi.  Abdomen: Soft, non tender, bowel sounds present.  Extremities: Mild bilateral lower extremity edema.  Wounds: Clean and dry. No erythema or signs of infection.  Discharge Condition:Stable  Recent laboratory studies:  Lab Results  Component Value Date   WBC 20.3* 11/29/2011   HGB 8.1* 11/29/2011   HCT 22.9* 11/29/2011   MCV 97.9 11/29/2011   PLT 387 11/29/2011   Lab Results  Component Value Date   NA 122* 11/29/2011   K 3.7 11/29/2011   CL 84* 11/29/2011   CO2 26 11/29/2011   CREATININE 1.66* 11/29/2011   GLUCOSE 107* 11/29/2011      Diagnostic Studies: Dg Chest 2 View  11/29/2011  *RADIOLOGY REPORT*  Clinical Data: Right-sided collapsed lung.  Chest tube removed yesterday.  CHEST - 2 VIEW  Comparison: 11/28/2011  Findings: Right central line tip overlies the superior vena cava. There is right-sided pleural effusion and parenchymal opacity, stable in appearance.  No evidence for pneumothorax.  Surgical sutures are identified at the right lung apex.  There are coarse, chronic markings at the left lung.  The heart is enlarged.  IMPRESSION:  1.  Stable appearance of the chest.  2.  No evidence for pneumothorax.  Original Report Authenticated By: Patterson Hammersmith, M.D.   Discharge Medications: Medication List  As of 12/01/2011 12:11 PM   STOP taking these medications         allopurinol 300 MG tablet      amLODipine 10 MG tablet      chlorthalidone  25 MG tablet      losartan 100 MG tablet         TAKE these medications         aspirin 325 MG tablet   Take 325 mg by mouth daily.      carvedilol 6.25 MG tablet   Commonly known as: COREG   Take 6.25 mg by mouth 2 (two) times daily with a meal.      gemfibrozil 600 MG tablet   Commonly known as: LOPID   Take 600 mg by mouth 2 (two) times daily before a meal.      lovastatin 10 MG tablet   Commonly known as: MEVACOR   Take 10 mg by mouth at bedtime.      oxyCODONE-acetaminophen 5-325 MG per tablet   Commonly known as: PERCOCET   Take 1 tablet by mouth every 4 (four) hours as needed for pain.             Follow Up Appointments: Follow-up Information    Follow up with Purcell Nails, MD. (PA/LAT CX[facility] (in same [place] as the office) 45 minutes prior to offic appointemnt Office will call with an appointment time)    Contact information:   [address] [place] [address] [phone]       Follow up with Carylon Perches, MD. (Call for a follow up for 1 week. )    Contact information:   [address] [place] [address] [phone]    Home health to remove chest tube sutures on Friday 12/04/2011. Home health to draw a BMET (renal insufficiency post op) on Monday 12/07/2011 and fax Results to Dr. Orvan July office [phone].      Signed: Corrin Sieling MPA-C 11/29/2011, 11:42 AM

## 2011-11-29 NOTE — Progress Notes (Addendum)
4 Days Post-Op Procedure(s) (LRB): VIDEO ASSISTED THORACOSCOPY (Right)  Subjective: Patient without complaints this am. Had a large bowel movement yesterday.  Objective: Vital signs in last 24 hours: Patient Vitals for the past 24 hrs:  BP Temp Temp src Pulse Resp SpO2  11/29/11 0300 118/52 mmHg 98.2 F (36.8 C) Oral 67  20  98 %  11/28/11 2300 114/45 mmHg 98.2 F (36.8 C) Oral 70  16  95 %  11/28/11 1900 105/44 mmHg 97.9 F (36.6 C) Oral 71  16  95 %  11/28/11 1600 121/42 mmHg 97.6 F (36.4 C) - 76  22  91 %  11/28/11 1200 104/45 mmHg 98.2 F (36.8 C) - 70  16  97 %    Current Weight  11/26/11 218 lb 0.6 oz (98.9 kg)      Intake/Output from previous day: 04/20 0701 - 04/21 0700 In: 360 [P.O.:240; I.V.:120] Out: 645 [Urine:625; Chest Tube:20]   Physical Exam:  Cardiovascular: RRR, no murmurs, gallops, or rubs. Pulmonary:Diminshed at right base; no rales, wheezes, or rhonchi. Abdomen: Soft, non tender, bowel sounds present. Extremities: Mild bilateral lower extremity edema. Wounds: Clean and dry.  No erythema or signs of infection.  Lab Results: CBC:  Basename 11/29/11 0500 11/28/11 0413  WBC 20.3* 21.3*  HGB 8.1* 8.2*  HCT 22.9* 22.6*  PLT 387 327   BMET:   Basename 11/29/11 0500 11/28/11 0413  NA 122* 121*  K 3.7 4.1  CL 84* 84*  CO2 26 25  GLUCOSE 107* 110*  BUN 67* 60*  CREATININE 1.66* 1.92*  CALCIUM 9.4 9.1    PT/INR: No results found for this basename: LABPROT,INR in the last 72 hours ABG:  INR: Will add last result for INR, ABG once components are confirmed Will add last 4 CBG results once components are confirmed  Assessment/Plan:  1.Pulmonary-Chest tube output 100 cc for last 24 hours. There is no air leak. Chest tube is to water seal.There is no air leak. CXR shows no ptx, some improvement in aeration on right.On 2L of O2 via [place]-wean as tolerates.He may need home O2. 2.Leukocytosis-WBC continues to decrease and he remains afebrile. Now  down to 20,00. 3.ABL anemia-H/H this am  4.ARI-creatinine continues to decrease. Now 1.66. ACE was stopped yesterday. 5.Hyponatremia likely due to volume excess.Give Lasix again. 6.Will give low dose of potassium to supplement. 7.Possible discharge in am.    Ardelle Balls PA-C 11/29/2011 9:17 AM   I have seen and examined the patient and agree with the assessment and plan as outlined, although Mr Jagoda's chest tube was removed yesterday.  CXR improved.  Creatinine improved.  Still on O2 - will wean as tolerated.  Possible d/c 1-2 days.  OWEN,CLARENCE H 11/29/2011 11:11 AM

## 2011-11-29 NOTE — Discharge Summary (Signed)
I agree with the above discharge summary and plan for follow-up.  [name]  

## 2011-11-29 NOTE — Discharge Instructions (Signed)
ACTIVITY:  1.Increase activity slowly. 2.Walk daily and increase frequency and duration as tolerates. 3.May walk up steps. 4.No lifting more than ten pounds for two weeks. 5.No driving for two weeks. 6.Avoid straining. 7.STOP any activity that causes chest pain, shortness of breath, dizziness,sweating, or  excessive weakness. 8.Continue with breathing exercises daily.  DIET: low fat and low salt  WOUND:  1.May shower. 2.Clean wounds with mild soap and water.  Call the office at [phone] if any problems arise.   Thoracoscopy Care After Refer to this sheet in the next few weeks. These discharge instructions provide you with general information on caring for yourself after you leave the hospital. Your caregiver may also give you specific instructions. Your treatment has been planned according to the most current medical practices available, but unavoidable complications sometimes occur. If you have any problems or questions after discharge, call your caregiver. HOME CARE INSTRUCTIONS   Remove the bandage (dressing) over your chest tube site as directed by your caregiver.   It is normal to be sore for a couple weeks following surgery. See your caregiver if this seems to be getting worse rather than better.   Only take over-the-counter or prescription medicines for pain, discomfort, or fever as directed by your caregiver. It is very important to take pain medicine when you need it so that you will cough and breathe deeply enough to clear mucus (phlegm) and expand your lungs.   If it hurts to cough, hold a pillow against your chest when you cough. This may help with the discomfort. In spite of the discomfort, cough frequently, as this helps protect against getting an infection in your lung (pneumonia).   Taking deep breaths keeps lungs inflated and protects against pneumonia. Most patients will go home with an incentive spirometer that encourages deep breathing.   You may resume a  normal diet and activities as directed.   Use showers for bathing until you see your caregiver, or as instructed.   Change dressings if necessary or as directed.   Avoid lifting or driving until you are instructed otherwise.   Make an appointment to see your caregiver for stitch (suture) or staple removal when instructed.   Do not travel by airplane for 2 weeks after the chest tube is removed.  SEEK MEDICAL CARE IF:   You are bleeding from your wounds.   You have redness, swelling, or increasing pain in the wounds.   Your heartbeat feels irregular or very fast.   There is pus coming from your wounds.   There is a bad smell coming from the wound or dressing.  SEEK IMMEDIATE MEDICAL CARE IF:   You have a fever.   You develop a rash.   You have difficulty breathing.   You develop any reaction or side effects to medicines given.   You develop lightheadedness or feel faint.   You develop shortness of breath or chest pain.  MAKE SURE YOU:   Understand these instructions.   Will watch your condition.   Will get help right away if you are not doing well or get worse.  Document Released: 02/13/2005 Document Revised: 07/16/2011 Document Reviewed: 01/14/2011 [facility] Patient Information 2012 [place], [place].

## 2011-11-30 ENCOUNTER — Inpatient Hospital Stay (HOSPITAL_COMMUNITY): Payer: Medicare HMO

## 2011-11-30 LAB — BASIC METABOLIC PANEL
CO2: 27 mEq/L (ref 19–32)
Calcium: 9.7 mg/dL (ref 8.4–10.5)
GFR calc non Af Amer: 44 mL/min — ABNORMAL LOW (ref >90)
Sodium: 121 mEq/L — ABNORMAL LOW (ref 135–145)

## 2011-11-30 MED ORDER — FUROSEMIDE 10 MG/ML IJ SOLN
40.0000 mg | Freq: Once | INTRAMUSCULAR | Status: AC
  Administered 2011-11-30: 40 mg via INTRAVENOUS
  Filled 2011-11-30: qty 4

## 2011-11-30 MED ORDER — POTASSIUM CHLORIDE CRYS ER 20 MEQ PO TBCR
20.0000 meq | EXTENDED_RELEASE_TABLET | Freq: Once | ORAL | Status: AC
  Administered 2011-11-30: 20 meq via ORAL
  Filled 2011-11-30: qty 1

## 2011-11-30 NOTE — Progress Notes (Signed)
TCTS BRIEF PROGRESS NOTE   Discussed case at length with Dr Hyman Hopes from Nephrology.  Given that renal ultrasound is normal and creatinine is gradually returning towards baseline, he recommended simply continuing to hold ARB and calcium channel blocker for now to let BP to come up a bit.  Will restart Norvasc when BP > 150 and continue to hold ARB until renal function completely normal.  Will also hold allopurinol for now.  Possible d/c home tomorrow if he continues to improve.  Dimarco Minkin H 11/30/2011 1:21 PM

## 2011-11-30 NOTE — Progress Notes (Signed)
Called in rm per pt request.  Pt stated that he stood up and O2 became disconnected.  O2 sats 81% on rm air when I entered rm.  O2 replaced, 2LNC, and sats back to WNL.

## 2011-11-30 NOTE — Progress Notes (Addendum)
                    [address]            [place] [address]          [phone]     5 Days Post-Op Procedure(s) (LRB): VIDEO ASSISTED THORACOSCOPY (Right)  Subjective: Still on 2L O2. Denies SOB, but desats with activity.  Not coughing much at this point.  Objective: Vital signs in last 24 hours: Patient Vitals for the past 24 hrs:  BP Temp Temp src Pulse Resp SpO2  11/30/11 0330 115/43 mmHg 98.3 F (36.8 C) Oral 73  22  96 %  11/30/11 0023 117/42 mmHg 98.7 F (37.1 C) Oral 69  18  95 %  11/29/11 2000 117/49 mmHg 98.5 F (36.9 C) Oral 71  21  97 %  11/29/11 1517 118/46 mmHg 97.8 F (36.6 C) Oral 72  19  93 %  11/29/11 1156 103/47 mmHg 97.6 F (36.4 C) Oral 70  20  95 %   Current Weight  11/26/11 98.9 kg (218 lb 0.6 oz)     Intake/Output from previous day: 04/21 0701 - 04/22 0700 In: 1200 [P.O.:1200] Out: 1475 [Urine:1475]    PHYSICAL EXAM:  Heart: RRR Lungs: decreased BS on R Wound: clean and dry   Lab Results: CBC: Basename 11/29/11 0500 11/28/11 0413  WBC 20.3* 21.3*  HGB 8.1* 8.2*  HCT 22.9* 22.6*  PLT 387 327   BMET:  Basename 11/30/11 0450 11/29/11 0500  NA 121* 122*  K 3.4* 3.7  CL 81* 84*  CO2 27 26  GLUCOSE 111* 107*  BUN 70* 67*  CREATININE 1.49* 1.66*  CALCIUM 9.7 9.4    CXR: stable R effusion, no obvious pneumothorax  Assessment/Plan: S/P Procedure(s) (LRB): VIDEO ASSISTED THORACOSCOPY (Right) Still O2 dependent.  Continue pulm toilet and attempt to wean as tolerated.  May need home O2- will order. Hyponatremia- Na is generally stable. Continue to watch. Still with R effusion.  Will give another dose of Lasix. Hypokalemia- replace K+. ARI- Cr trending down. Hopefully home ?in am if sats ok and otherwise stable.   LOS: 12 days    COLLINS,GINA H 11/30/2011   I have seen and examined the patient and agree with the assessment and plan as outlined.  I am still concerned about acute renal insufficiency,  although it has improved somewhat.  Will get renal ultrasound and ask Nephrology to see just to make certain no further work up is indicated.  Shandon Matson H 11/30/2011 8:02 AM

## 2011-12-01 MED ORDER — POTASSIUM CHLORIDE CRYS ER 20 MEQ PO TBCR
30.0000 meq | EXTENDED_RELEASE_TABLET | Freq: Once | ORAL | Status: AC
  Administered 2011-12-01: 30 meq via ORAL
  Filled 2011-12-01: qty 1

## 2011-12-01 NOTE — Progress Notes (Addendum)
6 Days Post-Op Procedure(s) (LRB): VIDEO ASSISTED THORACOSCOPY (Right)  Subjective: Patient without complaints this am. He really wants to go home.  Objective: Vital signs in last 24 hours: Patient Vitals for the past 24 hrs:  BP Temp Temp src Pulse Resp SpO2 Weight  12/01/11 0400 129/50 mmHg 98.3 F (36.8 C) Oral 67  16  99 % -  12/01/11 0032 115/56 mmHg 97.8 F (36.6 C) Oral 73  21  93 % -  11/30/11 2037 120/50 mmHg 97.2 F (36.2 C) Oral 74  20  96 % -  11/30/11 1600 131/39 mmHg - - - - - -  11/30/11 1530 127/36 mmHg 98.2 F (36.8 C) Oral - - - -  11/30/11 1200 - - - - - 88 % -  11/30/11 1120 105/45 mmHg 98.1 F (36.7 C) Oral 70  - - -  11/30/11 0800 114/46 mmHg 98.6 F (37 C) Oral 77  - - -    Current Weight  11/26/11 218 lb 0.6 oz (98.9 kg)      Intake/Output from previous day: 04/22 0701 - 04/23 0700 In: 1440 [P.O.:1440] Out: 2075 [Urine:2075]   Physical Exam:  Cardiovascular: RRR, no murmurs, gallops, or rubs. Pulmonary:Diminshed at right base; no rales, wheezes, or rhonchi. Abdomen: Soft, non tender, bowel sounds present. Extremities: Trace bilateral lower extremity edema. Wounds: Clean and dry.  No erythema or signs of infection.  Lab Results: CBC:  Basename 11/29/11 0500  WBC 20.3*  HGB 8.1*  HCT 22.9*  PLT 387   BMET:   Basename 11/30/11 0450 11/29/11 0500  NA 121* 122*  K 3.4* 3.7  CL 81* 84*  CO2 27 26  GLUCOSE 111* 107*  BUN 70* 67*  CREATININE 1.49* 1.66*  CALCIUM 9.7 9.4    PT/INR: No results found for this basename: LABPROT,INR in the last 72 hours ABG:  INR: Will add last result for INR, ABG once components are confirmed Will add last 4 CBG results once components are confirmed  Assessment/Plan:  1.Pulmonary-There is no air leak. On 2L of O2 via [place]-wean as tolerates.He may need home O2. 2.Leukocytosis-WBC continues to decrease and he remains afebrile. Now down to 20,00. 3.ABL anemia-Last H/H 8.1/22.9. 4.ARI-creatinine  continues to decrease. Now 1.48.   ACE and Allopurinol have been stopped.Renal US done yesterday showed no hydroneprhosis or mass. 5.Will give low dose of potassium to supplement. 6.Discharge with [facility] and home O2.    ZIMMERMAN,DONIELLE M PA-C 12/01/2011 7:59 AM     I have seen and examined the patient and agree with the assessment and plan as outlined.  Will d/c home and have Home Health RN keep an eye on blood pressure and draw repeat BMET early next week.  Continue to hold Cozaar, Norvasc, allopurinol and chlorthalidone for now.  Julitza Rickles H 12/01/2011 9:15 AM

## 2011-12-01 NOTE — Progress Notes (Signed)
Patient d/c to home with wife. Patients d/c meds, follow up apts and instructions given. V/S/S. No c/o pain at this time.

## 2011-12-01 NOTE — Progress Notes (Signed)
Room Air O2 sat sitting: 93% Room air ambulating :84% Recovery sitting post ambulating with 2L O2 [place]: 94%

## 2011-12-01 NOTE — Progress Notes (Signed)
   CARE MANAGEMENT NOTE 12/01/2011  Patient:  Charles [name], Charles [name]   Account Number:  [id number]  Date Initiated:  11/19/2011  Documentation initiated by:  Sharrie Rothman  Subjective/Objective Assessment:   Pt admitted from home with pneumothorax. Pt lives with wife and is indepedent with ADL's PTA.     Action/Plan:   No CM needs noted.   Anticipated DC Date:  11/25/2011   Anticipated DC Plan:  HOME/SELF CARE      DC Planning Services  CM consult      [facility] Choice  HOME HEALTH   Choice offered to / List presented to:  C-1 Patient   DME arranged  [facility]      DME agency  [facility]     HH arranged  HH-1 RN      [facility] agency  [facility]   Status of service:  Completed, signed off Medicare Important Message given?   (If response is "NO", the following Medicare IM given date fields will be blank) Date Medicare IM given:   Date Additional Medicare IM given:    Discharge Disposition:  HOME W HOME HEALTH SERVICES  Per UR Regulation:    If discussed at Long Length of Stay Meetings, dates discussed:   11/25/2011    Comments:  12/01/11 Loney Domingo,RN,BSN 1100 PT FOR DISCHARGE HOME TODAY WITH WIFE.  PT WILL NEED HOME O2; WILL NEED HOME HEALTH RN, PER MD ORDER.  REFERRAL TO [facility] FOR HOME O2, PER INSURANCE DME CONTRACT. REFERRAL TO [facility] FOR HHRN, PER PT CHOICE.  START OF CARE 24-48H POST DC DATE.  PORTABLE TANK TO BE DELIVERED TO PT'S ROOM PRIOR TO DC HOME. Phone #[phone]   11/25/11 Woodward Klem,RN,BSN VATS TODAY WITH DR Cornelius Moras.  WILL FOLLOW FOR DISCHARGE NEEDS.  11/23/11 1316 Arlyss Queen, RN BSN CM Pt is transfering to [facility] to services of Dr. Cornelius Moras. No CM needs at this time.  11/19/11 1400 Arlyss Queen, RN BSN CM

## 2011-12-01 NOTE — Discharge Summary (Signed)
I agree with the above discharge summary and plan for follow-up.  Gared Gillie H  

## 2011-12-09 ENCOUNTER — Encounter: Payer: Self-pay | Admitting: Thoracic Surgery (Cardiothoracic Vascular Surgery)

## 2011-12-10 ENCOUNTER — Other Ambulatory Visit: Payer: Self-pay | Admitting: Thoracic Surgery (Cardiothoracic Vascular Surgery)

## 2011-12-10 DIAGNOSIS — J930 Spontaneous tension pneumothorax: Secondary | ICD-10-CM

## 2011-12-14 ENCOUNTER — Ambulatory Visit
Admission: RE | Admit: 2011-12-14 | Discharge: 2011-12-14 | Disposition: A | Discharge status: Home / Self Care | Payer: Medicare HMO | Source: Ambulatory Visit | Attending: Thoracic Surgery (Cardiothoracic Vascular Surgery) | Admitting: Thoracic Surgery (Cardiothoracic Vascular Surgery)

## 2011-12-14 ENCOUNTER — Ambulatory Visit (INDEPENDENT_AMBULATORY_CARE_PROVIDER_SITE_OTHER): Payer: Self-pay | Admitting: Thoracic Surgery (Cardiothoracic Vascular Surgery)

## 2011-12-14 ENCOUNTER — Encounter: Payer: Self-pay | Admitting: Thoracic Surgery (Cardiothoracic Vascular Surgery)

## 2011-12-14 VITALS — BP 137/66 | HR 79 | Resp 20 | Ht 71.5 in | Wt 201.0 lb

## 2011-12-14 DIAGNOSIS — J939 Pneumothorax, unspecified: Secondary | ICD-10-CM

## 2011-12-14 DIAGNOSIS — J9383 Other pneumothorax: Secondary | ICD-10-CM

## 2011-12-14 DIAGNOSIS — Z09 Encounter for follow-up examination after completed treatment for conditions other than malignant neoplasm: Secondary | ICD-10-CM

## 2011-12-14 DIAGNOSIS — J930 Spontaneous tension pneumothorax: Secondary | ICD-10-CM

## 2011-12-14 NOTE — Progress Notes (Signed)
[address]            [name] [address]          [phone]     CARDIOTHORACIC SURGERY OFFICE NOTE  Referring Provider is Dalia Heading, MD PCP is Carylon Perches, MD, MD   HPI:  Patient returns for followup status post right video assisted thoracoscopy for stapling of blebs and pleuradesis for right spontaneous pneumothorax with persistent airleak. His postoperative course was notable for hypoxemia for which she ultimately was discharged home with home oxygen therapy. The patient does have remote history of tobacco abuse and COPD. The patient's postoperative course was also notable for acute renal insufficiency and hyponatremia which were improved at discharge. Since hospital discharge the patient has continued to make progress. Home health nursing care is been checking on him. However, there is been no attempt to wean him off of oxygen therapy at. He states that he is getting around reasonably well and he has been feeling better and better every day since he is on home. He has no pain. He's not having shortness of breath but he tires easily.   Current Outpatient Prescriptions  Medication Sig Dispense Refill  . allopurinol (ZYLOPRIM) 300 MG tablet Take 300 mg by mouth daily.       Marland Kitchen aspirin 325 MG tablet Take 325 mg by mouth daily.      . carvedilol (COREG) 6.25 MG tablet Take 6.25 mg by mouth 2 (two) times daily with a meal.      . gemfibrozil (LOPID) 600 MG tablet Take 600 mg by mouth 2 (two) times daily before a meal.      . lovastatin (MEVACOR) 10 MG tablet Take 10 mg by mouth at bedtime.      . naproxen sodium (ANAPROX) 220 MG tablet Take 220 mg by mouth as needed.      . niacin 500 MG tablet Take 500 mg by mouth 2 (two) times daily with a meal.          Physical Exam:   BP 137/66  Pulse 79  Resp 20  Ht 5' 11.5" (1.816 m)  Wt 201 lb (91.173 kg)  BMI 27.64 kg/m2  SpO2 94% The patient's oxygen saturation at rest on room air was 94% here with  ambulation it dropped to 89%  General:  Well-appearing  Chest:   Clear to auscultation  CV:   Regular rate and rhythm  Incisions:  Healing nicely  Abdomen:  Soft and nontender  Extremities:  Warm and well-perfused  Diagnostic Tests:  *RADIOLOGY REPORT*  Clinical Data: History of right VATS, follow-up, some shortness of  breath  CHEST - 2 VIEW 12/14/2011 Comparison: Chest x-ray of 11/30/2011, CT chest of 11/23/2011 and  presenting chest x-ray of 11/18/2011  Findings: No pneumothorax is seen. The lungs are not well aerated.  There is opacity within the right upper hemithorax located  posteriorly on the lateral view, which may represent loculated  effusion. Small pleural effusions are present with bibasilar  linear atelectasis. The heart is stable. No acute bony abnormality  is seen.  IMPRESSION:  Probable loculated fluid in the right upper posterior hemithorax.  Diminished aeration. Small effusions with basilar atelectasis.  Original Report Authenticated By: Juline Patch, M.D.  Basic metabolic panel performed 12/07/2011 revealed serum sodium increased to 131 with C-arm creatinine back to baseline at 0.76    Impression:  Patient is doing well 2 weeks following right video-assisted thoracoscopy for stapling of blebs and pleuradesis. He probably doesn't need oxygen therapy any longer.  Plan:  I've encouraged the patient to continue to gradually increase his physical activity as tolerated.  I have specifically encouraged him to start walking more. We will have the home health nurses check his oxygen saturations without oxygen therapy and perhaps discontinue oxygen therapy in the near future   [place] H. Cornelius Moras, MD 12/14/2011 11:33 AM

## 2011-12-14 NOTE — Patient Instructions (Signed)
Increase physical activity as tolerated without specific limitations. I've specifically advised the patient to increase how much he is walking and go for a 20 minute walk at least 2 or 3 times every day.

## 2012-01-13 ENCOUNTER — Other Ambulatory Visit: Payer: Self-pay | Admitting: Thoracic Surgery (Cardiothoracic Vascular Surgery)

## 2012-01-13 DIAGNOSIS — J930 Spontaneous tension pneumothorax: Secondary | ICD-10-CM

## 2012-01-18 ENCOUNTER — Encounter: Payer: Self-pay | Admitting: Thoracic Surgery (Cardiothoracic Vascular Surgery)

## 2012-01-18 ENCOUNTER — Ambulatory Visit (INDEPENDENT_AMBULATORY_CARE_PROVIDER_SITE_OTHER): Payer: Self-pay | Admitting: Thoracic Surgery (Cardiothoracic Vascular Surgery)

## 2012-01-18 ENCOUNTER — Ambulatory Visit
Admission: RE | Admit: 2012-01-18 | Discharge: 2012-01-18 | Disposition: A | Discharge status: Home / Self Care | Payer: Medicare HMO | Source: Ambulatory Visit | Attending: Thoracic Surgery (Cardiothoracic Vascular Surgery) | Admitting: Thoracic Surgery (Cardiothoracic Vascular Surgery)

## 2012-01-18 VITALS — BP 169/88 | HR 76 | Resp 20 | Ht 71.5 in | Wt 201.0 lb

## 2012-01-18 DIAGNOSIS — Z09 Encounter for follow-up examination after completed treatment for conditions other than malignant neoplasm: Secondary | ICD-10-CM | POA: Insufficient documentation

## 2012-01-18 DIAGNOSIS — J930 Spontaneous tension pneumothorax: Secondary | ICD-10-CM

## 2012-01-18 DIAGNOSIS — J9383 Other pneumothorax: Secondary | ICD-10-CM

## 2012-01-18 NOTE — Patient Instructions (Signed)
The patient has been advised to continue to gradually increase their physical activity as tolerated.  They should refrain from any heavy lifting or strenuous use of their arms and shoulders until at least 8 weeks from the time of their surgery, and they should avoid activities that cause increased pain in their chest on the side of their surgical incision.  

## 2012-01-18 NOTE — Progress Notes (Signed)
                   [address]            [name] [address]          [phone]     CARDIOTHORACIC SURGERY OFFICE NOTE  Referring Provider is Dalia Heading, MD PCP is Carylon Perches, MD, MD   HPI:  Patient returns for routine followup status post right that is for stapling of blebs and apical pleurectomy due to right spontaneous pneumothorax with prolonged air leak on 11/25/2011. His postoperative recovery has been uncomplicated. Since hospital discharge the patient has continued to do very well. He returns to the office today reporting no problems whatsoever. He has no significant residual pain. He states that his breathing continues to gradually improve. He notes that his exercise tolerance is probably not quite back to baseline, but overall he is getting along well and he is no problems or complaints.   Current Outpatient Prescriptions  Medication Sig Dispense Refill  . allopurinol (ZYLOPRIM) 300 MG tablet Take 300 mg by mouth daily.       Marland Kitchen aspirin 325 MG tablet Take 325 mg by mouth daily.      . carvedilol (COREG) 6.25 MG tablet Take 6.25 mg by mouth 2 (two) times daily with a meal.      . gemfibrozil (LOPID) 600 MG tablet Take 600 mg by mouth 2 (two) times daily before a meal.      . lovastatin (MEVACOR) 10 MG tablet Take 10 mg by mouth at bedtime.      . naproxen sodium (ANAPROX) 220 MG tablet Take 220 mg by mouth as needed.      . niacin 500 MG tablet Take 500 mg by mouth 2 (two) times daily with a meal.          Physical Exam:   BP 169/88  Pulse 76  Resp 20  Ht 5' 11.5" (1.816 m)  Wt 201 lb (91.173 kg)  BMI 27.64 kg/m2  SpO2 94%  General:  Well-appearing  Chest:   Clear to auscultation with symmetrical breath sounds  CV:   Regular rate and rhythm  Incisions:  Completely healed  Abdomen:  Soft and nontender  Extremities:  Warm and well-perfused  Diagnostic Tests:  *RADIOLOGY REPORT*   Clinical Data: Shortness of breath.   CHEST - 2 VIEW     Comparison: 12/14/2011.  CT scan from 11/23/2011.   Findings: Posterior loculated pleural fluid collection has decreased in the interval.  Chronic interstitial disease persists. The peripheral predominance and is unchanged. Cardiopericardial silhouette is at upper limits of normal for size. Imaged bony structures of the thorax are intact.   IMPRESSION: Interval decrease in size of the posterior loculated pleural fluid collection.  Otherwise stable exam.   Original Report Authenticated By: ERIC A. MANSELL, M.D.    Impression:  Patient is doing very well more than 6 weeks following right back for stapling of blebs with apical pleurectomy for right spontaneous pneumothorax with prolonged air leak.  He is doing very well in his chest x-ray looks good.   Plan:  I've encouraged patient to continue to gradually increase his physical activity as tolerated without any particular limitations at this time. All of his questions been addressed. In the future he will call and return to see [place] as needed.   Salvatore Decent. Cornelius Moras, MD 01/18/2012 11:46 AM

## 2012-02-29 ENCOUNTER — Encounter: Payer: Self-pay | Admitting: Internal Medicine

## 2012-08-06 IMAGING — CR DG CHEST 2V
2 series · 2 of 2 positions shown · non-contrast
Comparison: 12/14/2011.  CT scan from 11/23/2011.

CLINICAL DATA: Shortness of breath.

CHEST - 2 VIEW

[w chest pa]
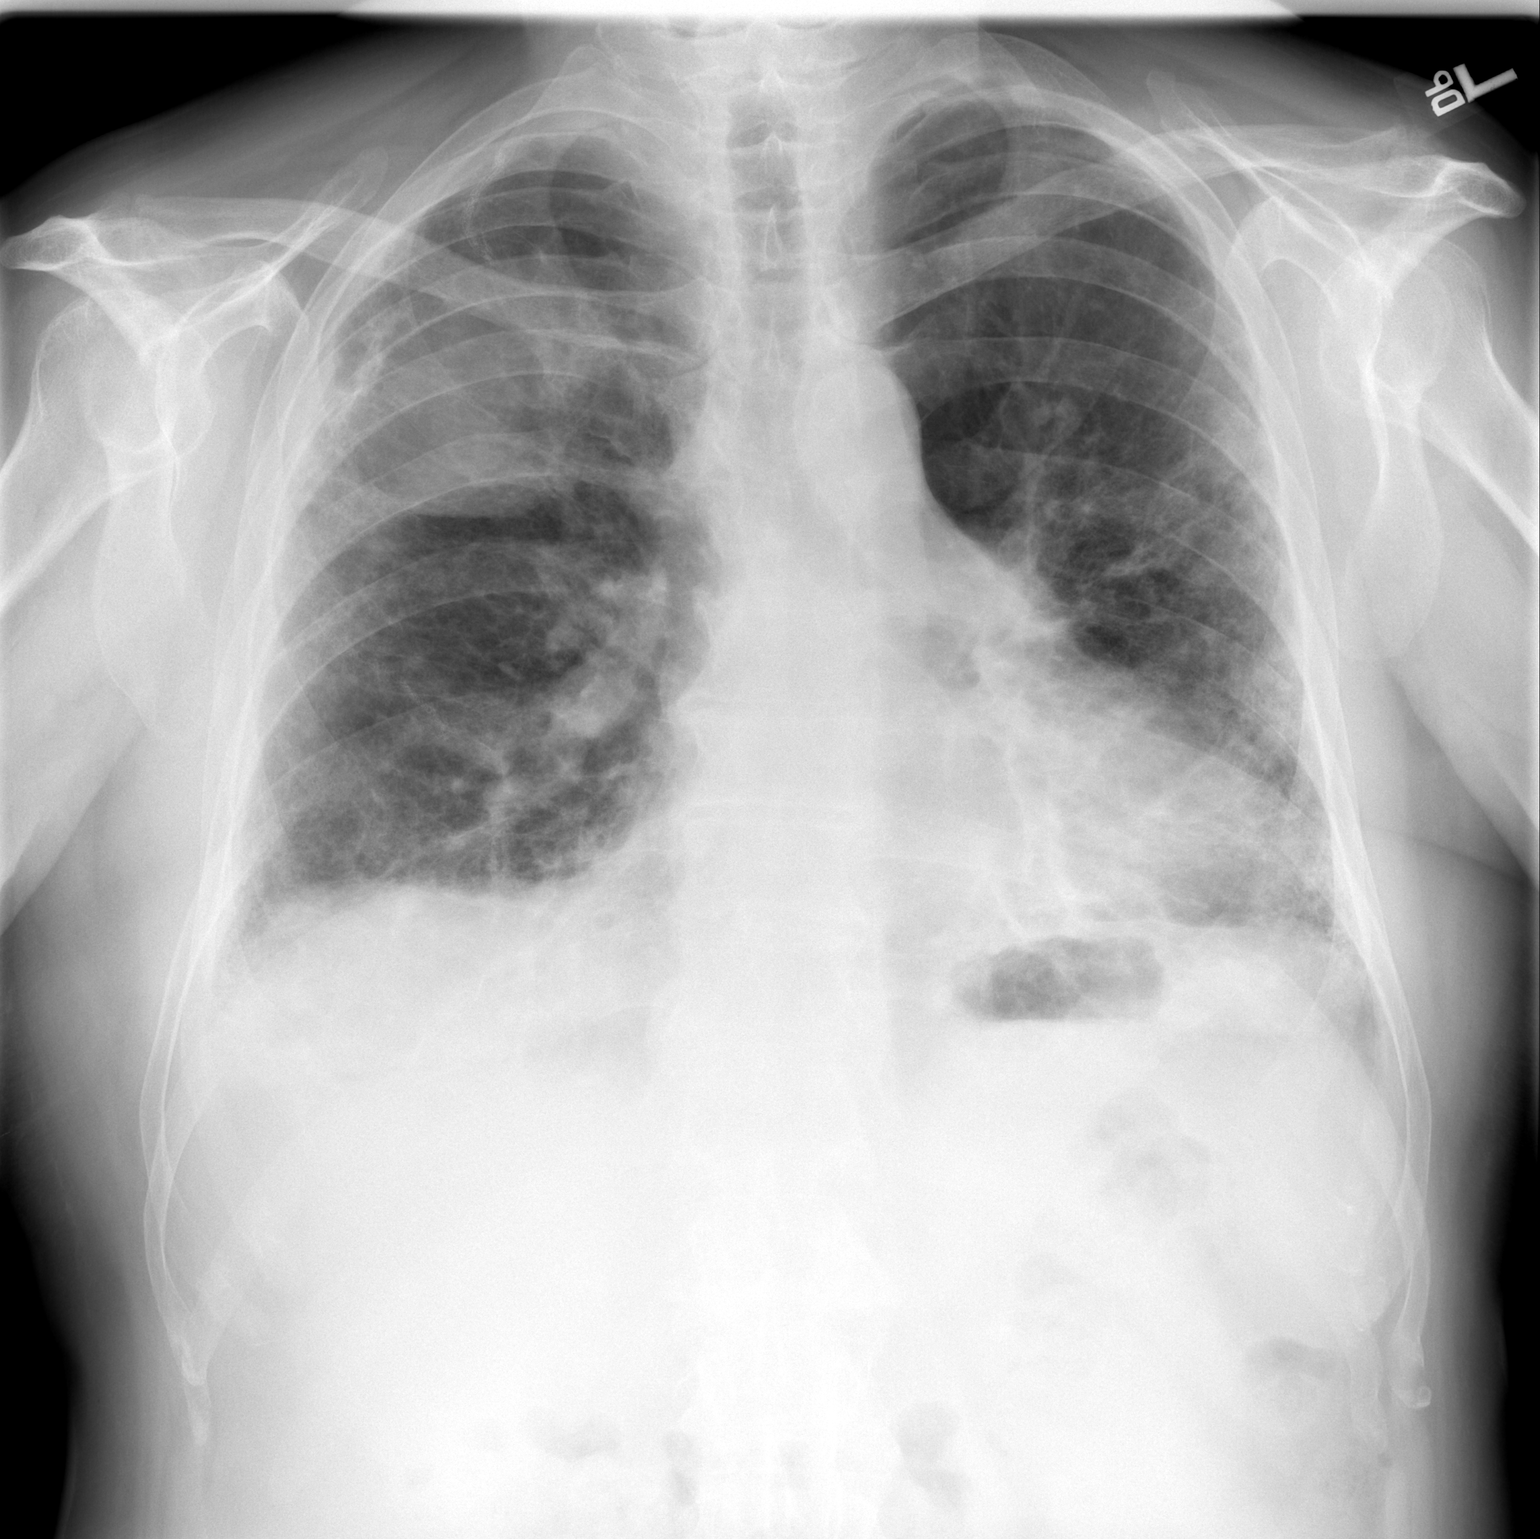

[w chest lat]
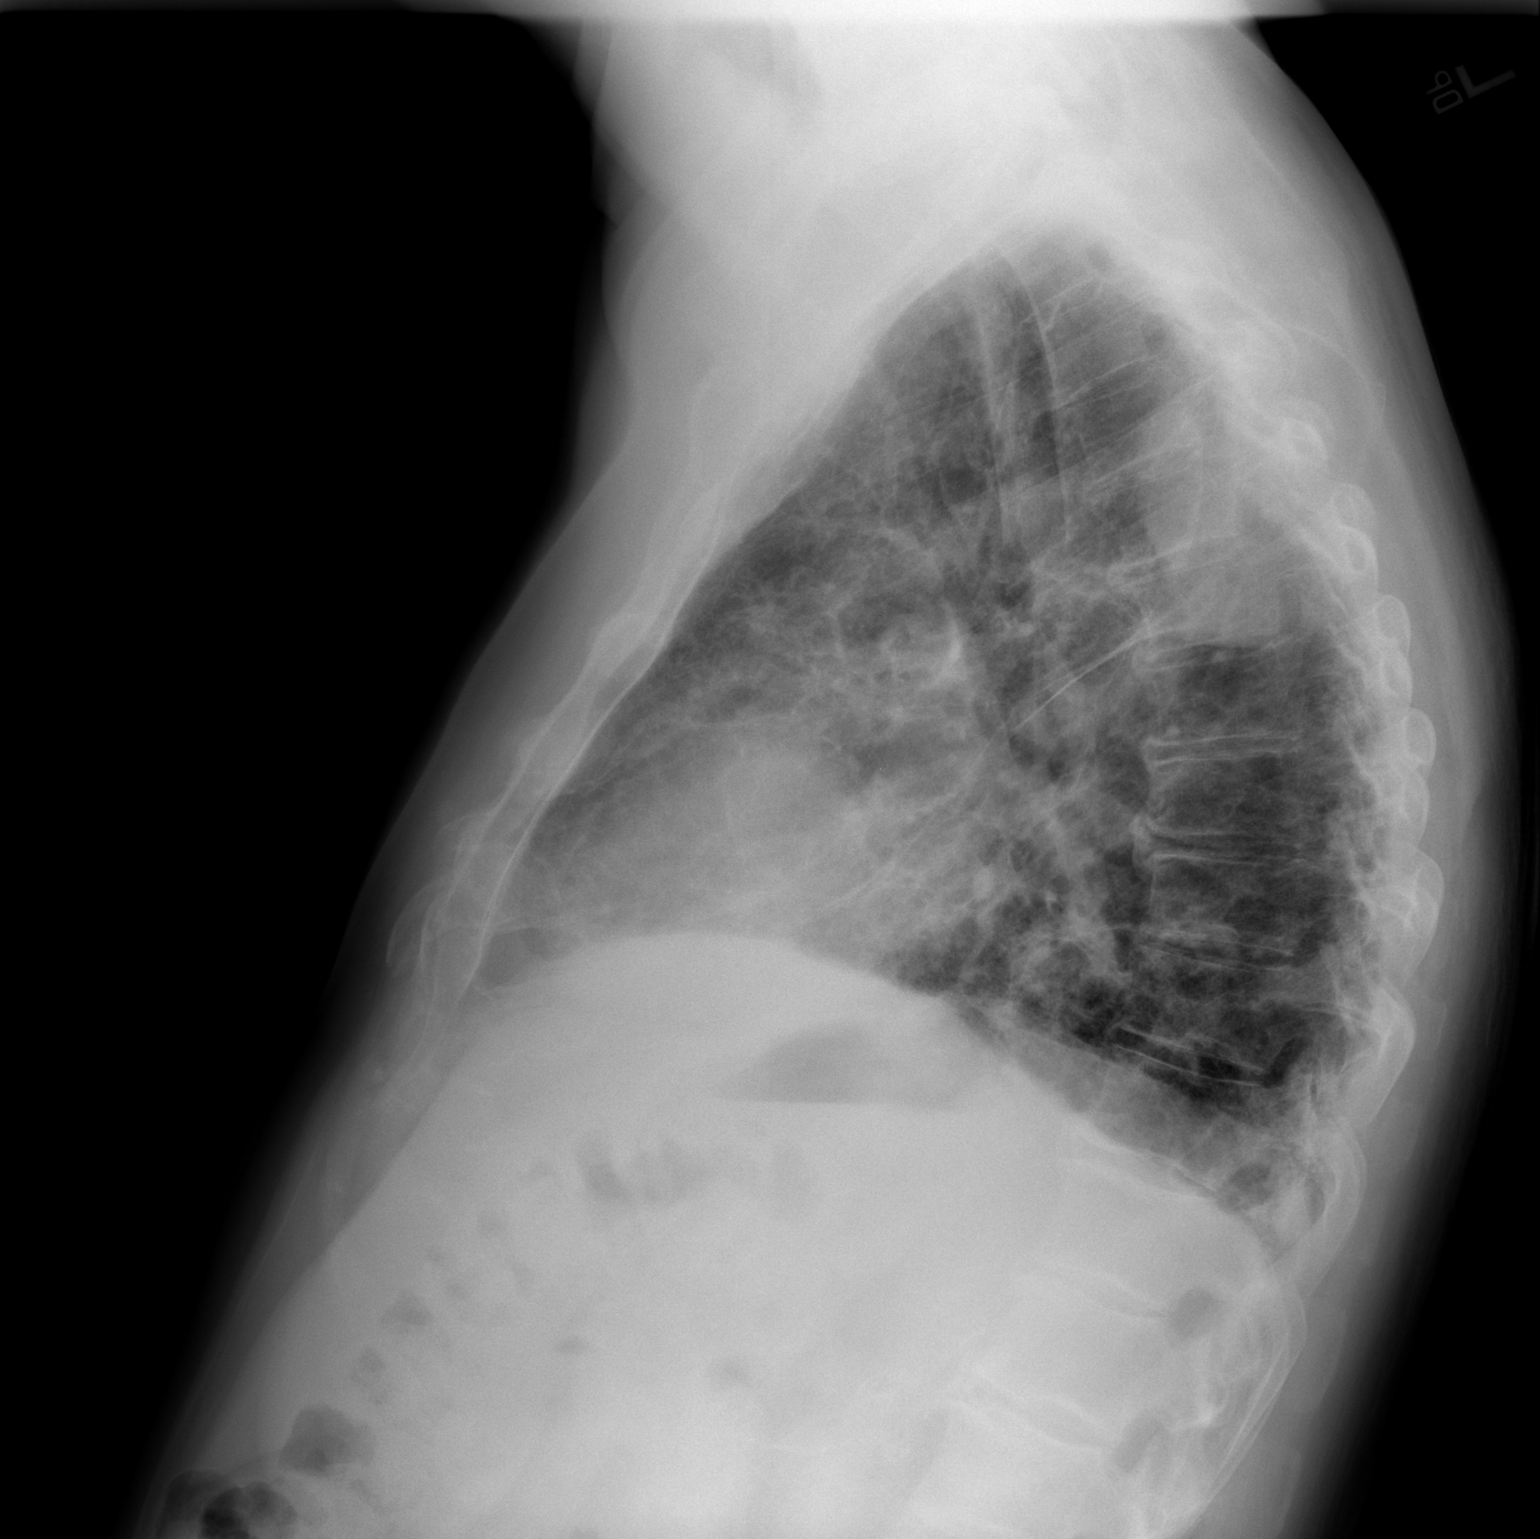

[2 of 2 positions shown; findings below may reference images not displayed]

FINDINGS: Posterior loculated pleural fluid collection has
decreased in the interval.  Chronic interstitial disease persists.
The peripheral predominance and is unchanged. Cardiopericardial
silhouette is at upper limits of normal for size. Imaged bony
structures of the thorax are intact.
IMPRESSION: Interval decrease in size of the posterior loculated pleural fluid
collection.  Otherwise stable exam.

## 2013-03-23 ENCOUNTER — Ambulatory Visit (INDEPENDENT_AMBULATORY_CARE_PROVIDER_SITE_OTHER): Payer: Medicare HMO | Admitting: Otolaryngology

## 2013-03-23 DIAGNOSIS — R439 Unspecified disturbances of smell and taste: Secondary | ICD-10-CM

## 2013-03-23 DIAGNOSIS — R070 Pain in throat: Secondary | ICD-10-CM

## 2013-04-06 ENCOUNTER — Ambulatory Visit (INDEPENDENT_AMBULATORY_CARE_PROVIDER_SITE_OTHER): Payer: Medicare HMO | Admitting: Otolaryngology

## 2013-04-06 DIAGNOSIS — K121 Other forms of stomatitis: Secondary | ICD-10-CM

## 2014-06-04 ENCOUNTER — Other Ambulatory Visit (HOSPITAL_COMMUNITY): Payer: Self-pay | Admitting: Internal Medicine

## 2014-06-04 ENCOUNTER — Ambulatory Visit (HOSPITAL_COMMUNITY)
Admission: RE | Admit: 2014-06-04 | Discharge: 2014-06-04 | Disposition: A | Discharge status: Home / Self Care | Payer: Medicare HMO | Source: Ambulatory Visit | Attending: Internal Medicine | Admitting: Internal Medicine

## 2014-06-04 DIAGNOSIS — R0602 Shortness of breath: Secondary | ICD-10-CM

## 2014-06-04 DIAGNOSIS — R05 Cough: Secondary | ICD-10-CM | POA: Diagnosis not present

## 2014-06-05 ENCOUNTER — Other Ambulatory Visit (HOSPITAL_COMMUNITY): Payer: Self-pay | Admitting: Internal Medicine

## 2014-06-05 DIAGNOSIS — J8410 Pulmonary fibrosis, unspecified: Secondary | ICD-10-CM

## 2014-06-07 ENCOUNTER — Other Ambulatory Visit (HOSPITAL_COMMUNITY): Payer: Self-pay | Admitting: Internal Medicine

## 2014-06-07 ENCOUNTER — Ambulatory Visit (HOSPITAL_COMMUNITY)
Admission: RE | Admit: 2014-06-07 | Discharge: 2014-06-07 | Disposition: A | Discharge status: Home / Self Care | Payer: Commercial Managed Care - HMO | Source: Ambulatory Visit | Attending: Internal Medicine | Admitting: Internal Medicine

## 2014-06-07 DIAGNOSIS — I2510 Atherosclerotic heart disease of native coronary artery without angina pectoris: Secondary | ICD-10-CM | POA: Diagnosis not present

## 2014-06-07 DIAGNOSIS — J8410 Pulmonary fibrosis, unspecified: Secondary | ICD-10-CM | POA: Insufficient documentation

## 2014-06-07 DIAGNOSIS — R0602 Shortness of breath: Secondary | ICD-10-CM | POA: Diagnosis present

## 2014-06-19 ENCOUNTER — Institutional Professional Consult (permissible substitution): Payer: Medicare HMO | Admitting: Pulmonary Disease

## 2014-06-20 ENCOUNTER — Encounter: Payer: Self-pay | Admitting: Pulmonary Disease

## 2014-06-20 ENCOUNTER — Institutional Professional Consult (permissible substitution): Payer: Medicare HMO | Admitting: Pulmonary Disease

## 2014-06-20 ENCOUNTER — Other Ambulatory Visit: Payer: Self-pay | Admitting: Pulmonary Disease

## 2014-06-20 ENCOUNTER — Ambulatory Visit (INDEPENDENT_AMBULATORY_CARE_PROVIDER_SITE_OTHER): Payer: Medicare HMO | Admitting: Pulmonary Disease

## 2014-06-20 ENCOUNTER — Other Ambulatory Visit (INDEPENDENT_AMBULATORY_CARE_PROVIDER_SITE_OTHER): Payer: Medicare HMO

## 2014-06-20 DIAGNOSIS — J8410 Pulmonary fibrosis, unspecified: Secondary | ICD-10-CM

## 2014-06-20 DIAGNOSIS — J9383 Other pneumothorax: Secondary | ICD-10-CM

## 2014-06-20 LAB — RHEUMATOID FACTOR

## 2014-06-20 LAB — SEDIMENTATION RATE: SED RATE: 32 mm/h — AB (ref 0–22)

## 2014-06-20 NOTE — Progress Notes (Signed)
Subjective:    Patient ID: Charles [name], male    DOB: Apr 13, 1933, 80 y.o.   MRN: [id number]  HPI The patient is a 80 year old male who I've been asked to see for an abnormal CT chest and hypoxemia. The patient tells me that he was last in his usual state of pulmonary health approximately 6 months before his spontaneous pneumothorax in 2013. At that time, he was able to split wood and lead a very active lifestyle. He ultimately had to undergo VATS with stapling of blebs and apical pleurectomy. His path just showed emphysematous changes and fibrosis. He feels that he has never returned to baseline since this procedure, and this spring noted an acceleration of his shortness of breath. He has gotten to the point that he gets winded walking even short to moderate distances, and describes a less than one block dyspnea on exertion at a moderate pace on flat ground. He also gets winded walking up one flight of stairs. He has a cough that produces scant white mucus, and most recently has been found to have hypoxemia for which she has been started on oxygen. The patient's chest x-ray in 2013 showed mild interstitial changes, but his current x-ray has clearly progressed. He has had a high-resolution CT that shows classic changes of usual interstitial pneumonia. The patient has never had pulmonary function studies, but he does have a history of smoking 1-1/2 packs per day for 22 years. He has not smoked since 1985. The patient has no history of autoimmune disease, and specifically denies any arthritis symptoms. He has no family history of autoimmune disease. 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 He does have a history of minimal asbestos exposure in the past. He has never been on nitrofurantoin or any type of antiarrhythmic agent. He has never been on any type of chemotherapy. He does tell me that his brother died from a lung disease, and is unclear  whether this was pulmonary fibrosis or possibly just COPD.   Review of Systems  Constitutional: Negative for fever and unexpected weight change.  HENT: Negative for congestion, dental problem, ear pain, nosebleeds, postnasal drip, rhinorrhea, sinus pressure, sneezing, sore throat and trouble swallowing.   Eyes: Negative for redness and itching.  Respiratory: Positive for cough and shortness of breath. Negative for chest tightness and wheezing.   Cardiovascular: Negative for palpitations and leg swelling.  Gastrointestinal: Negative for nausea and vomiting.  Genitourinary: Negative for dysuria.  Musculoskeletal: Negative for joint swelling.  Skin: Negative for rash.  Neurological: Negative for headaches.  Hematological: Does not bruise/bleed easily.  Psychiatric/Behavioral: Negative for dysphoric mood. The patient is not nervous/anxious.        Objective:   Physical Exam Constitutional:  Overweight male, no acute distress  HENT:  Nares patent without discharge, septal deviation to the left with crusting   Oropharynx without exudate, palate and uvula are normal  Eyes:  Perrla, eomi, no scleral icterus  Neck:  No JVD, no TMG  Cardiovascular:  Normal rate, regular rhythm, no rubs or gallops.  3/6 sem        Intact distal pulses  Pulmonary :  Normal breath sounds, no stridor or respiratory distress   No rhonchi, or wheezing.  +crackles 1/3 up bilat.  Abdominal:  Soft, nondistended, bowel sounds present.  No tenderness noted.   Musculoskeletal:  mild lower extremity edema noted.  Lymph Nodes:  No cervical lymphadenopathy noted  Skin:  No cyanosis noted  Neurologic:  Alert, appropriate, moves all 4 extremities without obvious deficit.         Assessment & Plan:

## 2014-06-20 NOTE — Assessment & Plan Note (Signed)
The patient has progressive dyspnea on exertion this year with a high-resolution CT that shows a classic pattern for usual interstitial pneumonitis. There is obvious progression from 2013 as well. I have explained to the patient this pattern can be seen in idiopathic pulmonary fibrosis, autoimmune disease, and occupational exposures. There is nothing by history to suggest a specific etiology for this, and we will need to check an autoimmune panel. If this is unremarkable, then his history and x-rays are most consistent with idiopathic pulmonary fibrosis. I would also like to do pulmonary function studies to establish his baseline as well. Finally, I have stressed to the patient the importance of wearing oxygen 24 hours a day, and we will work toward a more portable oxygen source.

## 2014-06-20 NOTE — Patient Instructions (Signed)
Stay on oxygen 24hrs a day.  Will work toward a more portable source. Will check bloodwork today to look for autoimmune disease. Will schedule for breathing studies in next 10-14 days, and will see you back the same day to review and go over bloodwork as well.  Can hold on symbicort until next visit since you feel it has not helped you.

## 2014-06-21 LAB — EXTRACTABLE NUCLEAR ANTIGEN ANTIBODY
ENA SM Ab Ser-aCnc: <1
SCLERODERMA (SCL-70) (ENA) ANTIBODY, IGG: NEGATIVE
SM/RNP: NEGATIVE
SSA (RO) (ENA) ANTIBODY, IGG: POSITIVE — AB
SSB (La) (ENA) Antibody, IgG: <1
ds DNA Ab: 1 IU/mL

## 2014-06-21 LAB — ANTI-DNA ANTIBODY, DOUBLE-STRANDED: ds DNA Ab: 1 IU/mL

## 2014-06-21 LAB — ANTI-SCLERODERMA ANTIBODY: SCLERODERMA (SCL-70) (ENA) ANTIBODY, IGG: NEGATIVE

## 2014-06-21 LAB — CYCLIC CITRUL PEPTIDE ANTIBODY, IGG: Cyclic Citrullin Peptide Ab: <2 U/mL (ref 0.0–5.0)

## 2014-06-21 LAB — SJOGRENS SYNDROME-A EXTRACTABLE NUCLEAR ANTIBODY: SSA (RO) (ENA) ANTIBODY, IGG: POSITIVE — AB

## 2014-06-21 LAB — ANTI-NUCLEAR AB-TITER (ANA TITER): ANA TITER 1: NEGATIVE (ref <1:40)

## 2014-06-21 LAB — SJOGRENS SYNDROME-B EXTRACTABLE NUCLEAR ANTIBODY: SSB (La) (ENA) Antibody, IgG: <1

## 2014-06-21 LAB — ANGIOTENSIN CONVERTING ENZYME: ANGIOTENSIN-CONVERTING ENZYME: 24 U/L (ref 8–52)

## 2014-06-21 LAB — ANA: ANA: POSITIVE — AB

## 2014-06-22 LAB — ALDOLASE: ALDOLASE: 4.9 U/L (ref <=8.1)

## 2014-06-26 LAB — ALPHA-1 ANTITRYPSIN PHENOTYPE: A-1 Antitrypsin: 175 mg/dL (ref 83–199)

## 2014-07-02 ENCOUNTER — Ambulatory Visit (HOSPITAL_COMMUNITY)
Admission: RE | Admit: 2014-07-02 | Discharge: 2014-07-02 | Disposition: A | Discharge status: Home / Self Care | Payer: Medicare HMO | Source: Ambulatory Visit | Attending: Pulmonary Disease | Admitting: Pulmonary Disease

## 2014-07-02 ENCOUNTER — Ambulatory Visit: Payer: Commercial Managed Care - HMO | Admitting: Pulmonary Disease

## 2014-07-02 DIAGNOSIS — J8410 Pulmonary fibrosis, unspecified: Secondary | ICD-10-CM | POA: Insufficient documentation

## 2014-07-02 LAB — PULMONARY FUNCTION TEST
DL/VA % pred: 42 %
DL/VA: 1.92 ml/min/mmHg/L
DLCO unc % pred: 22 %
DLCO unc: 6.95 ml/min/mmHg
FEF 25-75 PRE: 1.36 L/s
FEF 25-75 Post: 2.53 L/sec
FEF2575-%CHANGE-POST: 85 %
FEF2575-%PRED-POST: 131 %
FEF2575-%Pred-Pre: 71 %
FEV1-%CHANGE-POST: 14 %
FEV1-%PRED-PRE: 63 %
FEV1-%Pred-Post: 72 %
FEV1-POST: 1.99 L
FEV1-PRE: 1.75 L
FEV1FVC-%CHANGE-POST: 2 %
FEV1FVC-%PRED-PRE: 106 %
FEV6-%Change-Post: 10 %
FEV6-%Pred-Post: 69 %
FEV6-%Pred-Pre: 63 %
FEV6-POST: 2.53 L
FEV6-Pre: 2.29 L
FEV6FVC-%Pred-Post: 107 %
FEV6FVC-%Pred-Pre: 107 %
FVC-%CHANGE-POST: 10 %
FVC-%Pred-Post: 65 %
FVC-%Pred-Pre: 58 %
FVC-Post: 2.54 L
FVC-Pre: 2.29 L
POST FEV1/FVC RATIO: 79 %
PRE FEV1/FVC RATIO: 76 %
Post FEV6/FVC ratio: 100 %
Pre FEV6/FVC Ratio: 100 %
RV % pred: 49 %
RV: 1.27 L
TLC % pred: 51 %
TLC: 3.51 L

## 2014-07-02 MED ORDER — ALBUTEROL SULFATE (2.5 MG/3ML) 0.083% IN NEBU
2.5000 mg | INHALATION_SOLUTION | Freq: Once | RESPIRATORY_TRACT | Status: AC
  Administered 2014-07-02: 2.5 mg via RESPIRATORY_TRACT

## 2014-07-03 ENCOUNTER — Institutional Professional Consult (permissible substitution): Payer: Medicare HMO | Admitting: Pulmonary Disease

## 2014-07-04 ENCOUNTER — Telehealth: Payer: Self-pay | Admitting: Pulmonary Disease

## 2014-07-04 NOTE — Telephone Encounter (Signed)
Patient would like to know PFT results.  Do you have these?

## 2014-07-04 NOTE — Telephone Encounter (Signed)
I have called and LMOM.  [facility].

## 2014-07-06 NOTE — Telephone Encounter (Signed)
Result Note     Called to review pfts with pt. Got machine. LMOM about the breathing studies.        Niles Ess, please call pt on Friday, and arrange for him to come by and start on stiolto 2 inhalations each am. Will need to show him how to use device. Give him enough for 2 weeks, and make him an appt to see me in 2 weeks. Put at end of day IF you need to. Let him know that we will discuss everything in detail at that visit, and also see how he has responded to the new inhaler. Thanks    Called pt. He will come in on Monday to be shown how to use the inhaler stiolto. Samples placed upfront. Pt appt scheduled. Nothing further needed

## 2014-07-09 ENCOUNTER — Telehealth: Payer: Self-pay | Admitting: Pulmonary Disease

## 2014-07-09 NOTE — Telephone Encounter (Signed)
I spoke the pt and he wanted to know if his son can come and get the inhaler. I advised we need to teach the pt how to use the inhaler. He states we can show his son and he will show the pt. I advised this would be fine. nothing further needed. Carron CurieJennifer Castillo, CMA

## 2014-07-24 ENCOUNTER — Ambulatory Visit (INDEPENDENT_AMBULATORY_CARE_PROVIDER_SITE_OTHER): Payer: Commercial Managed Care - HMO | Admitting: Pulmonary Disease

## 2014-07-24 ENCOUNTER — Encounter: Payer: Self-pay | Admitting: Pulmonary Disease

## 2014-07-24 VITALS — BP 134/72 | HR 68 | Temp 98.0°F | Ht 70.0 in | Wt 215.4 lb

## 2014-07-24 DIAGNOSIS — J8410 Pulmonary fibrosis, unspecified: Secondary | ICD-10-CM

## 2014-07-24 NOTE — Patient Instructions (Signed)
Will refer you to a rheumatologist to evaluate for autoimmune disease, and give an opinion about one of your abnormal lab tests. Work on [employer] Will refer to pulmonary rehab at [facility]. Stay on oxygen Will call you once I receive information from the rheumatologist.

## 2014-07-24 NOTE — Assessment & Plan Note (Signed)
The patient's workup of his pulmonary fibrosis reveals a UIP type pattern on his CT chest, and an unremarkable autoimmune workup except for a positive SSA antibody.  His pulmonary function studies showed severe restriction and a severe decrease in diffusion capacity. His FEV1 was mildly reduced, and there was a 14% improvement with bronchodilator. I suspect he has minimal airflow obstruction, and he did not respond at all to a course of a LABA/LAMA. I have asked him to discontinue this and use only albuterol as needed. At this point, we need to decide if the patient has an autoimmune disease or not. I will send him to a rheumatologist for evaluation, and if he is felt to have some type of autoimmune process, I would give him a course of prednisone plus or minus another immunosuppressant. If he is not felt to have an autoimmune disorder, would start him on one of the newer anti-fibrotic agents. I have had a long discussion with him about these, and explained that it does not help his symptoms or his breathing capacity. They only reduce the rate of decline. I have also stressed to him the importance of weight reduction, physical conditioning, and staying on oxygen. I [place].

## 2014-07-24 NOTE — Progress Notes (Signed)
   Subjective:    Patient ID: Charles [name], male    DOB: 1933/03/09, 80 y.o.   MRN: [id number]  HPI The patient comes in today for follow-up of his known interstitial lung disease. He has a UIP pattern on his high-resolution CT, and recent PFTs showed severe restriction and a reduction in his DLCO. He did have minimal airflow obstruction with a positive response to bronchodilator, but saw no improvement in his breathing with stiolto. The patient also had an autoimmune workup done, and was positive only for his SSA antibody being elevated. I have reviewed all of the information with him in detail, and answered all of his questions.   Review of Systems  Constitutional: Negative for fever and unexpected weight change.  HENT: Positive for congestion. Negative for dental problem, ear pain, nosebleeds, postnasal drip, rhinorrhea, sinus pressure, sneezing, sore throat and trouble swallowing.   Eyes: Negative for redness and itching.  Respiratory: Positive for cough and shortness of breath. Negative for chest tightness and wheezing.   Cardiovascular: Negative for palpitations and leg swelling.  Gastrointestinal: Negative for nausea and vomiting.  Genitourinary: Negative for dysuria.  Musculoskeletal: Negative for joint swelling.  Skin: Negative for rash.  Neurological: Negative for headaches.  Hematological: Does not bruise/bleed easily.  Psychiatric/Behavioral: Negative for dysphoric mood. The patient is not nervous/anxious.        Objective:   Physical Exam Well-developed male in no acute distress Nose without purulence or discharge noted Neck without lymphadenopathy or thyromegaly Chest with crackles one third the way up bilaterally Cardiac exam with regular rate and rhythm Lower extremities with minimal edema, no cyanosis Alert and oriented, moves all 4 extremities.       Assessment & Plan:

## 2014-10-18 ENCOUNTER — Ambulatory Visit (INDEPENDENT_AMBULATORY_CARE_PROVIDER_SITE_OTHER): Payer: Medicare HMO | Admitting: Otolaryngology

## 2014-10-18 DIAGNOSIS — H903 Sensorineural hearing loss, bilateral: Secondary | ICD-10-CM | POA: Diagnosis not present

## 2014-10-18 DIAGNOSIS — H6122 Impacted cerumen, left ear: Secondary | ICD-10-CM

## 2014-11-15 ENCOUNTER — Ambulatory Visit (INDEPENDENT_AMBULATORY_CARE_PROVIDER_SITE_OTHER): Payer: Medicare HMO | Admitting: Otolaryngology

## 2014-11-15 DIAGNOSIS — H608X3 Other otitis externa, bilateral: Secondary | ICD-10-CM | POA: Diagnosis not present

## 2014-11-15 DIAGNOSIS — H903 Sensorineural hearing loss, bilateral: Secondary | ICD-10-CM | POA: Diagnosis not present

## 2014-12-13 ENCOUNTER — Other Ambulatory Visit (HOSPITAL_COMMUNITY): Payer: Self-pay | Admitting: Respiratory Therapy

## 2014-12-13 DIAGNOSIS — J84112 Idiopathic pulmonary fibrosis: Secondary | ICD-10-CM

## 2014-12-25 IMAGING — CT CT CHEST HIGH RESOLUTION W/O CM
2 of 7 series · 12 of 36 positions shown, 15 images · non-contrast
Comparison: Chest CT 11/23/2011.

CLINICAL DATA: 81-year-old male with history of pulmonary fibrosis
started on oxygen yesterday. Recently increasing shortness of
breath.

EXAM:
CT CHEST WITHOUT CONTRAST
TECHNIQUE: Multidetector CT imaging of the chest was performed following the
standard protocol without intravenous contrast. High resolution
imaging of the lungs, as well as inspiratory and expiratory imaging,
was performed.

[Series 4: hi res chest 5.0 b40f · axial · 0.69mm/px · z∈[+876,+1116]mm · 9 of 62 slices shown, 12 images]
[im 7/62  mediastinal]
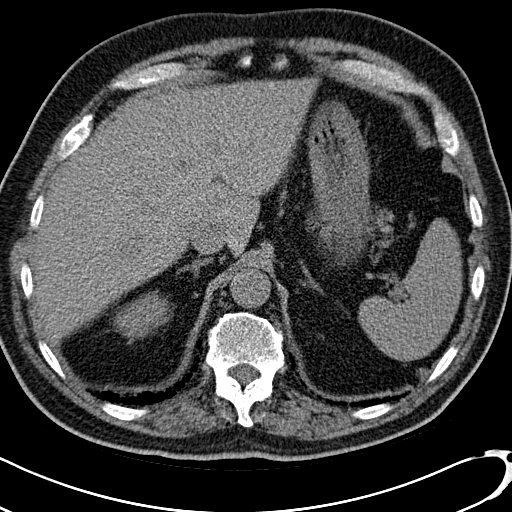
[im 7/62  lung]
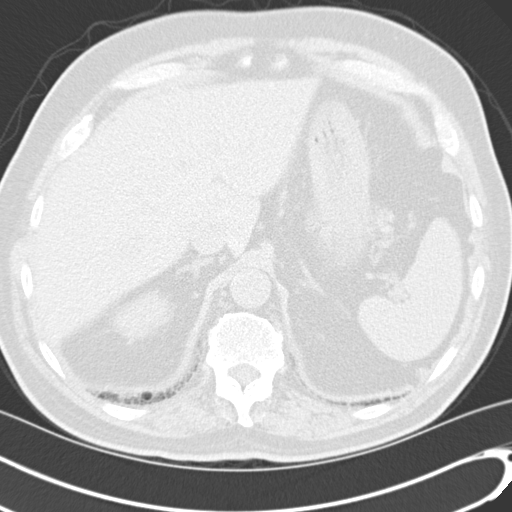
[im 13/62  lung]
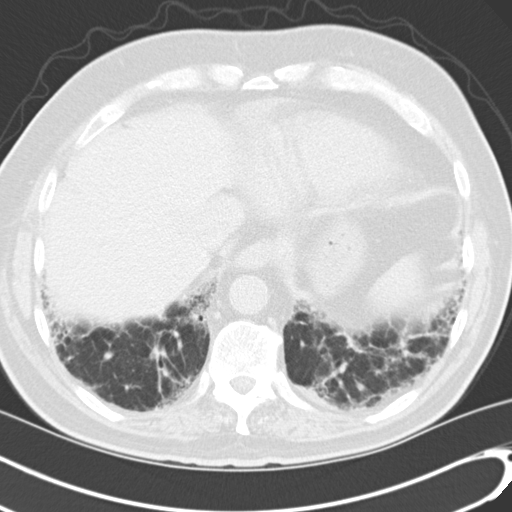
[im 19/62  lung]
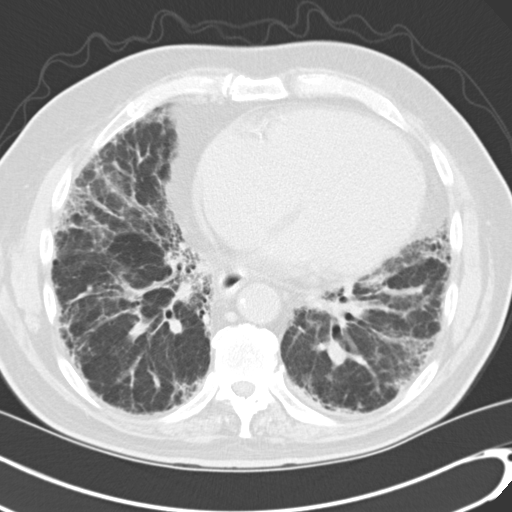
[im 25/62  lung]
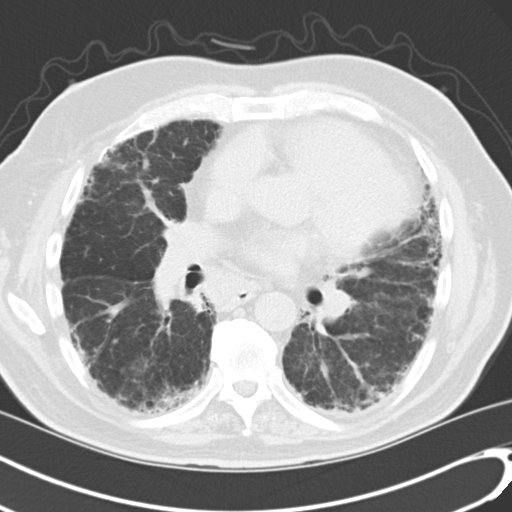
[im 31/62  mediastinal]
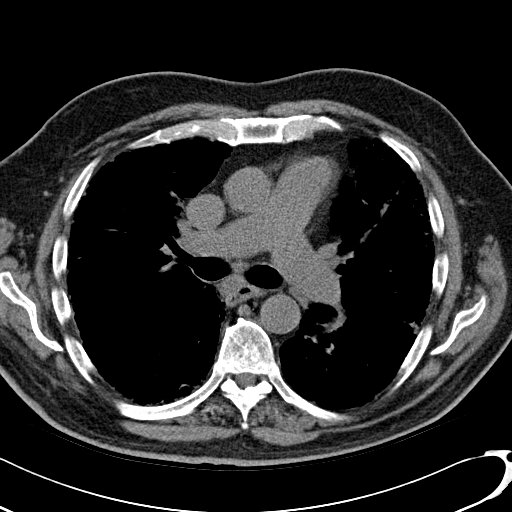
[im 31/62  lung]
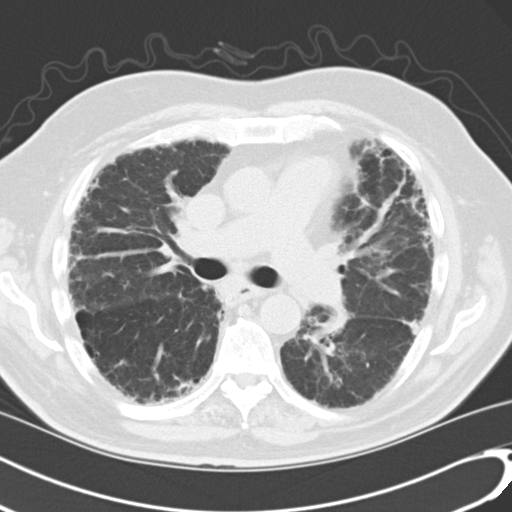
[im 37/62  lung]
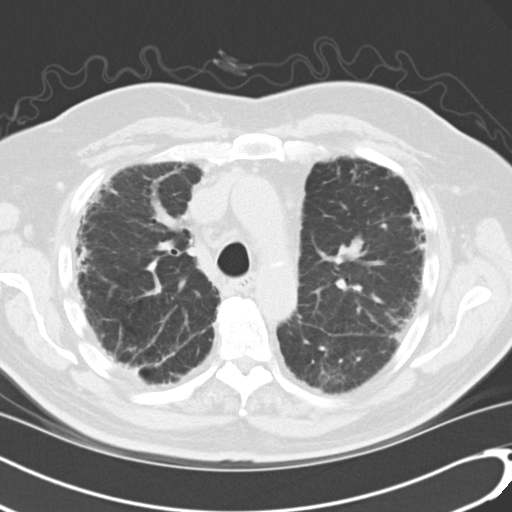
[im 43/62  lung]
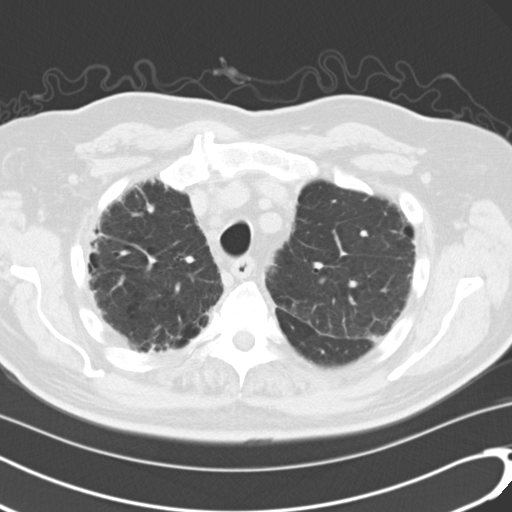
[im 49/62  lung]
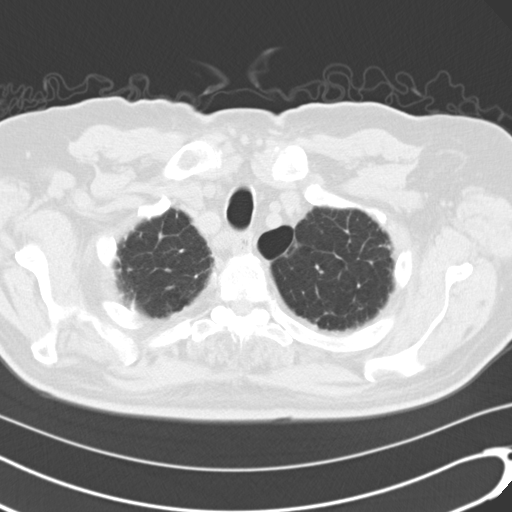
[im 55/62  mediastinal]
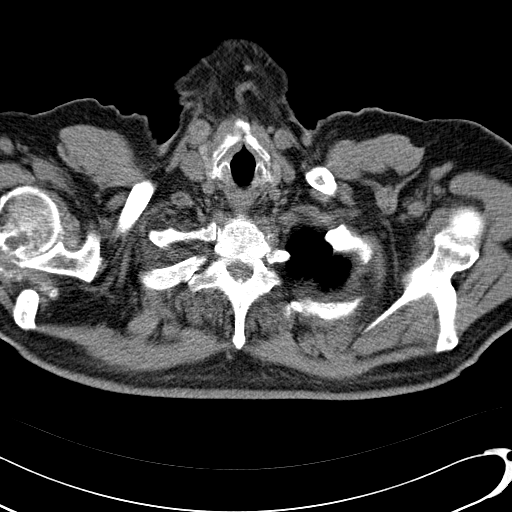
[im 55/62  lung]
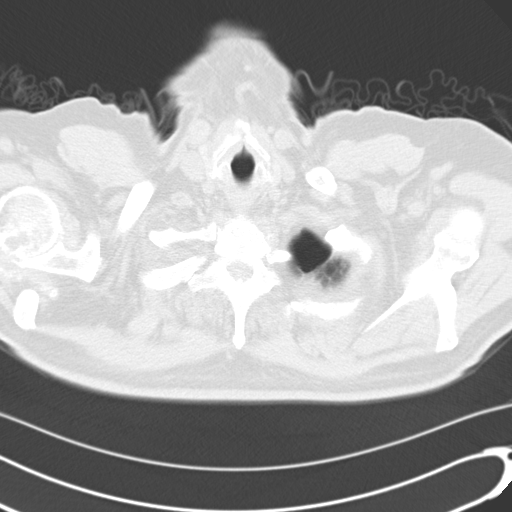

[Series 7: mpr coronal chest · coronal · 0.59mm/px · 3 of 106 slices shown]
[im 22/106  lung]
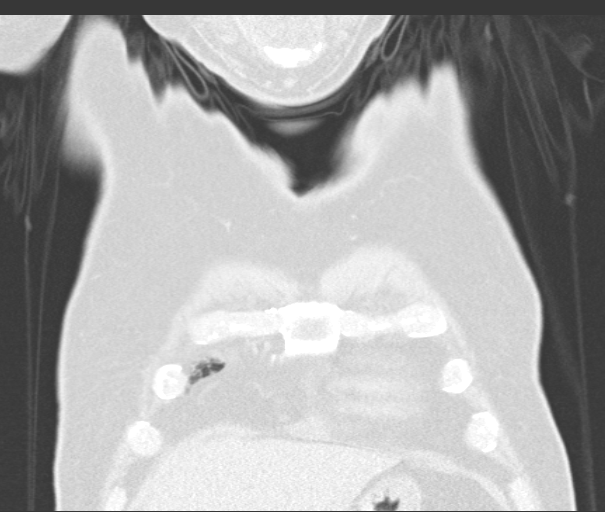
[im 43/106  lung]
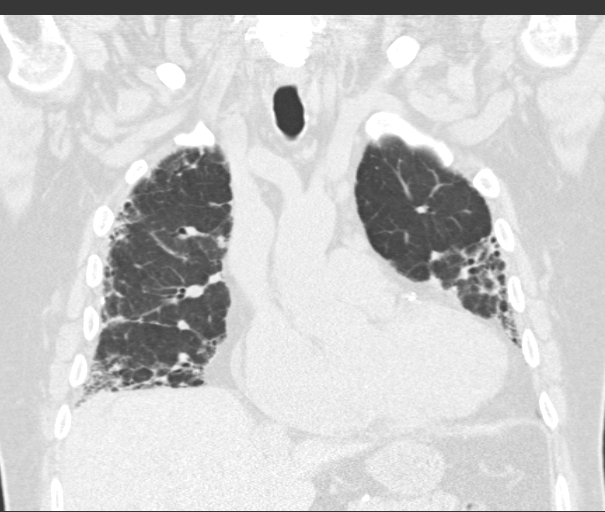
[im 64/106  lung]
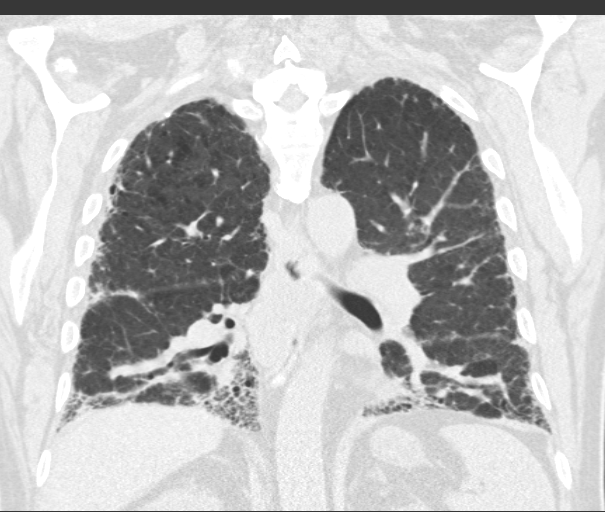

[12 of 36 positions shown; findings below may reference images not displayed]

FINDINGS: Mediastinum: Heart size is mildly enlarged with evidence of right
ventricular and right atrial dilatation. There is no significant
pericardial fluid, thickening or pericardial calcification. There is
atherosclerosis of the thoracic aorta, the great vessels of the
mediastinum and the coronary arteries, including calcified
atherosclerotic plaque in the left main, left anterior descending,
left circumflex and right coronary arteries. Numerous prominent
borderline and mildly enlarged lymph nodes in the mediastinal and
hilar stations bilaterally, with specific examples including a 13 mm
short axis prevascular lymph node, and cluster of subcarinal lymph
node measuring 2.3 x 3.3 cm. Mild dilatation of the pulmonic trunk
(3.2 cm in diameter). Esophagus is unremarkable in appearance.

Lungs/Pleura: High-resolution images demonstrate extensive
peripheral subpleural reticulation, parenchymal banding, traction
bronchiectasis and honeycombing; findings that have a definitive
craniocaudal gradient, and have clearly increased in severity
compared to prior study 11/23/2011. Inspiratory and expiratory
imaging is unremarkable. No confluent consolidative airspace
disease. No pleural effusions. No definite suspicious appearing
pulmonary nodules or masses are noted.

Upper Abdomen: 9 mm high attenuation lesion in the upper pole of the
left kidney is incompletely characterized, but favored to represent
a small proteinaceous or hemorrhagic cyst.

Musculoskeletal: There are no aggressive appearing lytic or blastic
lesions noted in the visualized portions of the skeleton.
IMPRESSION: 1. The appearance of the lungs is compatible with interstitial lung
disease, and the pattern and progression of findings compared to the
prior study is diagnostic of usual interstitial pneumonia (UIP).
Mediastinal and hilar lymphadenopathy is likely chronic and related
to underlying interstitial lung disease.
2. Atherosclerosis, including left main and 3 vessel coronary artery
disease. Assessment for potential risk factor modification, dietary
therapy or pharmacologic therapy may be warranted, if clinically
indicated.
3. Mild dilatation of the pulmonic trunk, and mild cardiomegaly with
evidence of right heart dilatation, suggesting elevated pulmonary
artery pressures, likely secondary to underlying chronic
interstitial lung disease.

## 2015-02-06 ENCOUNTER — Ambulatory Visit (HOSPITAL_COMMUNITY)
Admission: RE | Admit: 2015-02-06 | Discharge: 2015-02-06 | Disposition: A | Discharge status: Home / Self Care | Payer: Medicare HMO | Source: Ambulatory Visit | Attending: Pulmonary Disease | Admitting: Pulmonary Disease

## 2015-02-06 DIAGNOSIS — J84112 Idiopathic pulmonary fibrosis: Secondary | ICD-10-CM | POA: Diagnosis present

## 2015-02-06 MED ORDER — ALBUTEROL SULFATE (2.5 MG/3ML) 0.083% IN NEBU
2.5000 mg | INHALATION_SOLUTION | Freq: Once | RESPIRATORY_TRACT | Status: AC
  Administered 2015-02-06: 2.5 mg via RESPIRATORY_TRACT

## 2015-02-11 LAB — PULMONARY FUNCTION TEST
DL/VA % pred: 39 %
DL/VA: 1.79 ml/min/mmHg/L
DLCO unc % pred: 18 %
DLCO unc: 5.81 ml/min/mmHg
FEF 25-75 Post: 2.13 L/sec
FEF 25-75 Pre: 1.41 L/sec
FEF2575-%CHANGE-POST: 50 %
FEF2575-%Pred-Post: 112 %
FEF2575-%Pred-Pre: 74 %
FEV1-%Change-Post: 8 %
FEV1-%PRED-PRE: 64 %
FEV1-%Pred-Post: 70 %
FEV1-POST: 1.94 L
FEV1-Pre: 1.78 L
FEV1FVC-%CHANGE-POST: 1 %
FEV1FVC-%PRED-PRE: 108 %
FEV6-%Change-Post: 6 %
FEV6-%Pred-Post: 67 %
FEV6-%Pred-Pre: 63 %
FEV6-PRE: 2.29 L
FEV6-Post: 2.44 L
FEV6FVC-%PRED-PRE: 107 %
FEV6FVC-%Pred-Post: 107 %
FVC-%Change-Post: 6 %
FVC-%Pred-Post: 63 %
FVC-%Pred-Pre: 59 %
FVC-PRE: 2.29 L
FVC-Post: 2.44 L
POST FEV1/FVC RATIO: 79 %
PRE FEV1/FVC RATIO: 78 %
PRE FEV6/FVC RATIO: 100 %
Post FEV6/FVC ratio: 100 %
RV % PRED: 65 %
RV: 1.7 L
TLC % PRED: 56 %
TLC: 3.9 L

## 2016-05-13 ENCOUNTER — Emergency Department (HOSPITAL_COMMUNITY): Payer: Medicare HMO

## 2016-05-13 ENCOUNTER — Inpatient Hospital Stay (HOSPITAL_COMMUNITY)
Admission: EM | Admit: 2016-05-13 | Discharge: 2016-05-16 | DRG: 378 | Disposition: A | Discharge status: Home / Self Care | Payer: Medicare HMO | Attending: Internal Medicine | Admitting: Internal Medicine

## 2016-05-13 ENCOUNTER — Encounter (HOSPITAL_COMMUNITY): Payer: Self-pay | Admitting: Emergency Medicine

## 2016-05-13 DIAGNOSIS — E7800 Pure hypercholesterolemia, unspecified: Secondary | ICD-10-CM | POA: Diagnosis present

## 2016-05-13 DIAGNOSIS — R739 Hyperglycemia, unspecified: Secondary | ICD-10-CM | POA: Diagnosis present

## 2016-05-13 DIAGNOSIS — D62 Acute posthemorrhagic anemia: Secondary | ICD-10-CM | POA: Diagnosis present

## 2016-05-13 DIAGNOSIS — K5791 Diverticulosis of intestine, part unspecified, without perforation or abscess with bleeding: Secondary | ICD-10-CM | POA: Diagnosis present

## 2016-05-13 DIAGNOSIS — Z7982 Long term (current) use of aspirin: Secondary | ICD-10-CM | POA: Diagnosis not present

## 2016-05-13 DIAGNOSIS — I491 Atrial premature depolarization: Secondary | ICD-10-CM | POA: Diagnosis not present

## 2016-05-13 DIAGNOSIS — I517 Cardiomegaly: Secondary | ICD-10-CM | POA: Diagnosis present

## 2016-05-13 DIAGNOSIS — D72829 Elevated white blood cell count, unspecified: Secondary | ICD-10-CM | POA: Diagnosis present

## 2016-05-13 DIAGNOSIS — K922 Gastrointestinal hemorrhage, unspecified: Secondary | ICD-10-CM | POA: Diagnosis present

## 2016-05-13 DIAGNOSIS — Z7952 Long term (current) use of systemic steroids: Secondary | ICD-10-CM

## 2016-05-13 DIAGNOSIS — I10 Essential (primary) hypertension: Secondary | ICD-10-CM

## 2016-05-13 DIAGNOSIS — Z79899 Other long term (current) drug therapy: Secondary | ICD-10-CM

## 2016-05-13 DIAGNOSIS — Z9981 Dependence on supplemental oxygen: Secondary | ICD-10-CM

## 2016-05-13 DIAGNOSIS — D539 Nutritional anemia, unspecified: Secondary | ICD-10-CM | POA: Diagnosis present

## 2016-05-13 DIAGNOSIS — J449 Chronic obstructive pulmonary disease, unspecified: Secondary | ICD-10-CM | POA: Diagnosis present

## 2016-05-13 DIAGNOSIS — T380X5A Adverse effect of glucocorticoids and synthetic analogues, initial encounter: Secondary | ICD-10-CM | POA: Diagnosis present

## 2016-05-13 DIAGNOSIS — N189 Chronic kidney disease, unspecified: Secondary | ICD-10-CM | POA: Diagnosis present

## 2016-05-13 DIAGNOSIS — J8410 Pulmonary fibrosis, unspecified: Secondary | ICD-10-CM | POA: Diagnosis present

## 2016-05-13 DIAGNOSIS — Z87891 Personal history of nicotine dependence: Secondary | ICD-10-CM

## 2016-05-13 DIAGNOSIS — I129 Hypertensive chronic kidney disease with stage 1 through stage 4 chronic kidney disease, or unspecified chronic kidney disease: Secondary | ICD-10-CM | POA: Diagnosis present

## 2016-05-13 DIAGNOSIS — J9611 Chronic respiratory failure with hypoxia: Secondary | ICD-10-CM | POA: Diagnosis present

## 2016-05-13 DIAGNOSIS — E785 Hyperlipidemia, unspecified: Secondary | ICD-10-CM | POA: Diagnosis present

## 2016-05-13 HISTORY — DX: Dyspnea, unspecified: R06.00

## 2016-05-13 HISTORY — DX: Cardiac murmur, unspecified: R01.1

## 2016-05-13 HISTORY — DX: Pulmonary fibrosis, unspecified: J84.10

## 2016-05-13 LAB — POC OCCULT BLOOD, ED: FECAL OCCULT BLD: POSITIVE — AB

## 2016-05-13 LAB — I-STAT CHEM 8, ED
BUN: 61 mg/dL — AB (ref 6–20)
CHLORIDE: 104 mmol/L (ref 101–111)
CREATININE: 1.4 mg/dL — AB (ref 0.61–1.24)
Calcium, Ion: 1.08 mmol/L — ABNORMAL LOW (ref 1.15–1.40)
Glucose, Bld: 210 mg/dL — ABNORMAL HIGH (ref 65–99)
HEMATOCRIT: 35 % — AB (ref 39.0–52.0)
Hemoglobin: 11.9 g/dL — ABNORMAL LOW (ref 13.0–17.0)
POTASSIUM: 4.2 mmol/L (ref 3.5–5.1)
Sodium: 139 mmol/L (ref 135–145)
TCO2: 16 mmol/L (ref 0–100)

## 2016-05-13 LAB — COMPREHENSIVE METABOLIC PANEL
ALT: 14 U/L — AB (ref 17–63)
AST: 16 U/L (ref 15–41)
Albumin: 2.9 g/dL — ABNORMAL LOW (ref 3.5–5.0)
Alkaline Phosphatase: 61 U/L (ref 38–126)
Anion gap: 15 (ref 5–15)
BUN: 65 mg/dL — ABNORMAL HIGH (ref 6–20)
CHLORIDE: 107 mmol/L (ref 101–111)
CO2: 15 mmol/L — AB (ref 22–32)
CREATININE: 1.59 mg/dL — AB (ref 0.61–1.24)
Calcium: 8 mg/dL — ABNORMAL LOW (ref 8.9–10.3)
GFR calc non Af Amer: 39 mL/min — ABNORMAL LOW (ref >60)
GFR, EST AFRICAN AMERICAN: 45 mL/min — AB (ref >60)
Glucose, Bld: 222 mg/dL — ABNORMAL HIGH (ref 65–99)
POTASSIUM: 4.3 mmol/L (ref 3.5–5.1)
SODIUM: 137 mmol/L (ref 135–145)
Total Bilirubin: 0.9 mg/dL (ref 0.3–1.2)
Total Protein: 5.2 g/dL — ABNORMAL LOW (ref 6.5–8.1)

## 2016-05-13 LAB — CBC WITH DIFFERENTIAL/PLATELET
BASOS PCT: 0 %
Basophils Absolute: 0 10*3/uL (ref 0.0–0.1)
EOS ABS: 0 10*3/uL (ref 0.0–0.7)
EOS PCT: 0 %
HCT: 33.8 % — ABNORMAL LOW (ref 39.0–52.0)
Hemoglobin: 11.5 g/dL — ABNORMAL LOW (ref 13.0–17.0)
Lymphocytes Relative: 5 %
Lymphs Abs: 1.2 10*3/uL (ref 0.7–4.0)
MCH: 37.8 pg — ABNORMAL HIGH (ref 26.0–34.0)
MCHC: 34 g/dL (ref 30.0–36.0)
MCV: 111.2 fL — AB (ref 78.0–100.0)
Monocytes Absolute: 0.5 10*3/uL (ref 0.1–1.0)
Monocytes Relative: 2 %
NEUTROS PCT: 92 %
Neutro Abs: 20.4 10*3/uL — ABNORMAL HIGH (ref 1.7–7.7)
PLATELETS: 200 10*3/uL (ref 150–400)
RBC: 3.04 MIL/uL — ABNORMAL LOW (ref 4.22–5.81)
RDW: 15.1 % (ref 11.5–15.5)
WBC: 22.5 10*3/uL — ABNORMAL HIGH (ref 4.0–10.5)

## 2016-05-13 LAB — TYPE AND SCREEN
ABO/RH(D): O POS
ANTIBODY SCREEN: NEGATIVE

## 2016-05-13 LAB — TROPONIN I: Troponin I: 0.03 ng/mL (ref <0.03)

## 2016-05-13 LAB — PROTIME-INR
INR: 1.07
PROTHROMBIN TIME: 14 s (ref 11.4–15.2)

## 2016-05-13 LAB — BRAIN NATRIURETIC PEPTIDE: B Natriuretic Peptide: 196 pg/mL — ABNORMAL HIGH (ref 0.0–100.0)

## 2016-05-13 MED ORDER — SODIUM CHLORIDE 0.9 % IV BOLUS (SEPSIS)
1000.0000 mL | Freq: Once | INTRAVENOUS | Status: AC
  Administered 2016-05-13: 1000 mL via INTRAVENOUS

## 2016-05-13 MED ORDER — SODIUM CHLORIDE 0.9 % IV BOLUS (SEPSIS)
1000.0000 mL | Freq: Once | INTRAVENOUS | Status: AC
  Administered 2016-05-14: 1000 mL via INTRAVENOUS

## 2016-05-13 NOTE — H&P (Addendum)
History and Physical    Charles [name]:Charles [name] DOB: 11-Jun-1933 DOA: 05/13/2016  PCP: Carylon Perches, MD  Patient coming from: Home   Chief Complaint: GI bleed   HPI: Charles [name] is a 82 y.o. male with a past medical history significant for pulmonary fibrosis, HTN, and HLD presented by EMS with complaints of a lower GI bleed that onset 2 hours ago. He is on 3L of home oxygen and has been on Prednisone for his COPD.  He had colonoscopy in the past, and it showed diverticulosis. In the ER, he remained hemodynamically stable, and hospitalist was asked to admit him for stable LGIB.  His EKG showed PAC and atrial tachycardia with unifocal PVCs.  Hospitalist was asked to admit patient lower GI bleed. He has no abdominal pain.   ED Course: Creatinine 1.40. Glucose 210. Hemoglobin 11.9. Occult blood test positive.   Review of Systems: As per HPI otherwise 10 point review of systems negative.    Past Medical History:  Diagnosis Date  . Hypercholesteremia   . Hypertension   . Pulmonary fibrosis (HCC)   . Spontaneous pneumothorax 11/23/2011   Right Side    Past Surgical History:  Procedure Laterality Date  . CHEST TUBE INSERTION  11/20/2011  . TONSILLECTOMY    . VIDEO ASSISTED THORACOSCOPY  11/25/2011   Procedure: VIDEO ASSISTED THORACOSCOPY;  Surgeon: Purcell Nails, MD;  Location: [facility] OR;  Service: Thoracic;  Laterality: Right;  (R) VATS W/STAPLEING OF BLEB     reports that he quit smoking about 32 years ago. His smoking use included Cigarettes. He has a 33.00 pack-year smoking history. He has never used smokeless tobacco. He reports that he drinks alcohol. He reports that he does not use drugs.  No Known Allergies  Family History  Problem Relation Age of Onset  . Pulmonary fibrosis Brother     twin brother    Prior to Admission medications   Medication Sig Start Date End Date Taking? Authorizing Provider  allopurinol (ZYLOPRIM) 300 MG tablet Take 300 mg by mouth daily.   12/01/11   Historical Provider, MD  amLODipine (NORVASC) 10 MG tablet Take 10 mg by mouth daily.  06/16/14   Historical Provider, MD  aspirin 325 MG tablet Take 325 mg by mouth daily.    Historical Provider, MD  carvedilol (COREG) 6.25 MG tablet Take 6.25 mg by mouth 2 (two) times daily with a meal.    Historical Provider, MD  chlorthalidone (HYGROTON) 25 MG tablet Once daily 06/07/14   Historical Provider, MD  gemfibrozil (LOPID) 600 MG tablet Take 600 mg by mouth 2 (two) times daily before a meal.    Historical Provider, MD  losartan (COZAAR) 100 MG tablet Once daily 06/16/14   Historical Provider, MD  lovastatin (MEVACOR) 10 MG tablet Take 10 mg by mouth at bedtime.    Historical Provider, MD  naproxen sodium (ANAPROX) 220 MG tablet Take 220 mg by mouth as needed.    Historical Provider, MD  PROAIR HFA 108 (90 BASE) MCG/ACT inhaler Inhale 2 puffs into the lungs every 6 (six) hours as needed.  05/28/14   Historical Provider, MD  Tiotropium Bromide-Olodaterol (STIOLTO RESPIMAT) 2.5-2.5 MCG/ACT AERS Inhale 2 puffs into the lungs daily.    Historical Provider, MD    Physical Exam: Vitals:   05/13/16 2122 05/13/16 2127 05/13/16 2130 05/13/16 2200  BP:  96/64 (!) 95/47 106/58  Pulse:  103 98 (!) 121  Resp:  18 24 (!) 43  Temp:  97.3 F (36.3 C)    TempSrc:  Oral    SpO2:  94% 92% 93%  Weight: 88.5 kg (195 lb)     Height: 5\' 10"[id number]$  (1.778 m)       Constitutional: NAD, calm, comfortable Vitals:   05/13/16 2122 05/13/16 2127 05/13/16 2130 05/13/16 2200  BP:  96/64 (!) 95/47 106/58  Pulse:  103 98 (!) 121  Resp:  18 24 (!) 43  Temp:  97.3 F (36.3 C)    TempSrc:  Oral    SpO2:  94% 92% 93%  Weight: 88.5 kg (195 lb)     Height: 5\' 10"  (1.778 m)      Eyes: PERRL, lids and conjunctivae normal ENMT: Mucous membranes are moist. Posterior pharynx clear of any exudate or lesions.Normal dentition.  Neck: normal, supple, no masses, no thyromegaly Respiratory: clear to auscultation bilaterally,  no wheezing, no crackles. Normal respiratory effort. No accessory muscle use.  Cardiovascular: Regular rate and rhythm, no murmurs / rubs / gallops. No extremity edema. 2+ pedal pulses. No carotid bruits.  Abdomen: no tenderness, no masses palpated. No hepatosplenomegaly. Bowel sounds positive.  Musculoskeletal: no clubbing / cyanosis. No joint deformity upper and lower extremities. Good ROM, no contractures. Normal muscle tone.  Skin: no rashes, lesions, ulcers. No induration Neurologic: CN 2-12 grossly intact. Sensation intact, DTR normal. Strength 5/5 in all 4.  Psychiatric: Normal judgment and insight. Alert and oriented x 3. Normal mood.    Labs on Admission: I have personally reviewed following labs and imaging studies  CBC:  Recent Labs Lab 05/13/16 2145 05/13/16 2146  WBC 22.5*  --   NEUTROABS 20.4*  --   HGB 11.5* 11.9*  HCT 33.8* 35.0*  MCV 111.2*  --   PLT 200  --    Basic Metabolic Panel:  Recent Labs Lab 05/13/16 2145 05/13/16 2146  NA 137 139  K 4.3 4.2  CL 107 104  CO2 15*  --   GLUCOSE 222* 210*  BUN 65* 61*  CREATININE 1.59* 1.40*  CALCIUM 8.0*  --    Liver Function Tests:  Recent Labs Lab 05/13/16 2145  AST 16  ALT 14*  ALKPHOS 61  BILITOT 0.9  PROT 5.2*  ALBUMIN 2.9*    EKG: Independently reviewed.   Assessment/Plan  1. GI bleed. BRBPR, painless, in patient with known diverticulosis, I suspect this is a lower GIB from diverticulosis.  Appears to be hemodynamically stable. Hgb 11.9, improving since admission. Will order occult blood test. GI consultation. Start on Protonix orally.  Will type and screen and follow Hct every 6 hours.  Admit him to telemetry.  2. HTN. Pressures were low on arrival, now controlled. Will hold his BP meds.  Give IVF.  3. HLD. Continue statin.  4. Pulmonary fibrosis. Continue current treatment. He is on chronic steroids, will give stress dose and resume prednisone orally, adding PPI.   DVT prophylaxis: SCDS    Code Status: Full  Family Communication: Family bedside Disposition Plan: Discharge home once improved.  Consults called: GI  Admission status: Inpatient    Houston SirenPeter Foch Rosenwald, MD FACP [facility] If 7PM-7AM, please contact night-coverage [email or web address] Password [email or web address]  05/13/2016, 10:27 PM    By signing my name below, I, Cynda AcresHailei Fulton, attest that this documentation has been prepared under the direction and in the presence of Houston SirenPeter Oddie Kuhlmann, MD. Electronically signed: Cynda AcresHailei Fulton, Scribe. 05/13/16 10:46 PM

## 2016-05-13 NOTE — ED Triage Notes (Signed)
EMS called out for difficulty  Breathing,  Sat  84% on 3L in the home. CBG 28, reports rectal bleeding, red blood, a lot, pt is pale, alert at present, 400 ML of NS given in route 96/50

## 2016-05-13 NOTE — ED Notes (Signed)
CRITICAL VALUE ALERT  Critical value received:  troponin 0.03  Date of notification:  05/13/16  Time of notification:  2250  Critical value read back:Yes.    Nurse who received alert:  Rcockram   Time MD responded:  Dr Estell HarpinZammit

## 2016-05-13 NOTE — ED Notes (Signed)
Charles [name] notified of pt's vital signs, additional orders given, advised to continue with same bed assignment,

## 2016-05-13 NOTE — ED Notes (Signed)
Dr. Lee at the bedside.

## 2016-05-13 NOTE — ED Provider Notes (Signed)
[facility]-EMERGENCY DEPT Provider Note   CSN: [id number] Arrival date & time: 05/13/16  2119  By signing my name below, I, Christy SartoriusAnastasia Kolousek, attest that this documentation has been prepared under the direction and in the presence of Bethann BerkshireJoseph Uriah Philipson, MD . Electronically Signed: Christy SartoriusAnastasia Kolousek, Scribe. 05/13/2016. 9:44 PM.  History   Chief Complaint Chief Complaint  Patient presents with  . GI Bleeding   The history is provided by the patient and medical records. No language interpreter was used.  Rectal Bleeding  Quality:  Bright red Amount:  Moderate Chronicity:  New Relieved by:  Nothing Associated symptoms: no abdominal pain   Risk factors: no anticoagulant use      HPI Comments:  Charles [name] is a 82 y.o. male with a history of pulmonary fibrosis brought in by ambulance, who presents to the Emergency Department complaining of GI bleeding that began about an hour and a half ago.  Pt's wife told EMS that his stool contained a substantial amount of bright red blood.  Pt has also had low O2 Sat: 84% on 3L at home.  Pt states that he does not take asprin or any other blood thinners.  No alleviating factors noted.  No additional symptoms or complaints.    Past Medical History:  Diagnosis Date  . Hypercholesteremia   . Hypertension   . Pulmonary fibrosis (HCC)   . Spontaneous pneumothorax 11/23/2011   Right Side    Patient Active Problem List   Diagnosis Date Noted  . 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 Follow-up examination, following unspecified surgery 01/18/2012  . Spontaneous pneumothorax 11/23/2011  . Hypertension 11/23/2011  . Hyperlipidemia 11/23/2011    Past Surgical History:  Procedure Laterality Date  . CHEST TUBE INSERTION  11/20/2011  . TONSILLECTOMY    . VIDEO ASSISTED THORACOSCOPY  11/25/2011   Procedure: VIDEO ASSISTED THORACOSCOPY;  Surgeon: Purcell Nailslarence H Owen, MD;  [facility];  Service: Thoracic;  Laterality: Right;  (R) VATS  W/STAPLEING OF BLEB       Home Medications    Prior to Admission medications   Medication Sig Start Date End Date Taking? Authorizing Provider  allopurinol (ZYLOPRIM) 300 MG tablet Take 300 mg by mouth daily.  12/01/11   Historical Provider, MD  amLODipine (NORVASC) 10 MG tablet Take 10 mg by mouth daily.  06/16/14   Historical Provider, MD  aspirin 325 MG tablet Take 325 mg by mouth daily.    Historical Provider, MD  carvedilol (COREG) 6.25 MG tablet Take 6.25 mg by mouth 2 (two) times daily with a meal.    Historical Provider, MD  chlorthalidone (HYGROTON) 25 MG tablet Once daily 06/07/14   Historical Provider, MD  gemfibrozil (LOPID) 600 MG tablet Take 600 mg by mouth 2 (two) times daily before a meal.    Historical Provider, MD  losartan (COZAAR) 100 MG tablet Once daily 06/16/14   Historical Provider, MD  lovastatin (MEVACOR) 10 MG tablet Take 10 mg by mouth at bedtime.    Historical Provider, MD  naproxen sodium (ANAPROX) 220 MG tablet Take 220 mg by mouth as needed.    Historical Provider, MD  PROAIR HFA 108 (90 BASE) MCG/ACT inhaler Inhale 2 puffs into the lungs every 6 (six) hours as needed.  05/28/14   Historical Provider, MD  Tiotropium Bromide-Olodaterol (STIOLTO RESPIMAT) 2.5-2.5 MCG/ACT AERS Inhale 2 puffs into the lungs daily.    Historical Provider, MD    Family History Family History  Problem Relation Age of Onset  .  Pulmonary fibrosis Brother     twin brother    Social History Social History  Substance Use Topics  . Smoking status: Former Smoker    Packs/day: 1.50    Years: 22.00    Types: Cigarettes    Quit date: 11/18/1983  . Smokeless tobacco: Never Used  . Alcohol use Yes     Comment: 2 beers or 2 mixed drinks     Allergies   Review of patient's allergies indicates no known allergies.   Review of Systems Review of Systems  Constitutional: Negative for appetite change and fatigue.  HENT: Negative for congestion, ear discharge and sinus pressure.     Eyes: Negative for discharge.  Respiratory: Negative for cough.   Cardiovascular: Negative for chest pain.  Gastrointestinal: Positive for anal bleeding, blood in stool and hematochezia. Negative for abdominal pain and diarrhea.  Genitourinary: Negative for frequency and hematuria.  Musculoskeletal: Negative for back pain.  Skin: Negative for rash.  Neurological: Negative for seizures and headaches.  Psychiatric/Behavioral: Negative for hallucinations.     Physical Exam Updated Vital Signs BP 96/64 (BP Location: Right Arm)   Pulse 103   Temp 97.3 F (36.3 C) (Oral)   Resp 18   Ht 5\' 10"  (1.778 m)   Wt 195 lb (88.5 kg)   SpO2 94%   BMI 27.98 kg/m   Physical Exam  Constitutional: He is oriented to person, place, and time. He appears well-developed.  HENT:  Head: Normocephalic.  Eyes: Conjunctivae and EOM are normal. No scleral icterus.  Neck: Neck supple. No thyromegaly present.  Cardiovascular: Normal rate and regular rhythm.  Exam reveals no gallop and no friction rub.   No murmur heard. Pulmonary/Chest: No stridor. He has wheezes. He has no rales. He exhibits no tenderness.  Mild wheezing bilaterally.  Abdominal: He exhibits no distension. There is no tenderness. There is no rebound.  Genitourinary:  Genitourinary Comments: Rectal exam showed bright red blood.    Musculoskeletal: Normal range of motion. He exhibits no edema.  Lymphadenopathy:    He has no cervical adenopathy.  Neurological: He is oriented to person, place, and time. He exhibits normal muscle tone. Coordination normal.  Skin: No rash noted. No erythema.  Psychiatric: He has a normal mood and affect. His behavior is normal.     ED Treatments / Results   DIAGNOSTIC STUDIES:  Oxygen Saturation is 94% on RA, NML by my interpretation.    COORDINATION OF CARE:  9:37 PM Discussed treatment plan with pt at bedside and pt agreed to plan.  Labs (all labs ordered are listed, but only abnormal results  are displayed) Labs Reviewed  POC OCCULT BLOOD, ED - Abnormal; Notable for the following:       Result Value   Fecal Occult Bld POSITIVE (*)    All other components within normal limits  CBC WITH DIFFERENTIAL/PLATELET  COMPREHENSIVE METABOLIC PANEL  PROTIME-INR  I-STAT CHEM 8, ED  TYPE AND SCREEN    EKG  EKG Interpretation None       Radiology No results found.  Procedures Procedures (including critical care time)  Medications Ordered in ED Medications  sodium chloride 0.9 % bolus 1,000 mL (not administered)   CRITICAL CARE Performed by: Aubrie Lucien L Total critical care time:45 minutes Critical care time was exclusive of separately billable procedures and treating other patients. Critical care was necessary to treat or prevent imminent or life-threatening deterioration. Critical care was time spent personally by me on the following activities: development of  treatment plan with patient and/or surrogate as well as nursing, discussions with consultants, evaluation of patient's response to treatment, examination of patient, obtaining history from patient or surrogate, ordering and performing treatments and interventions, ordering and review of laboratory studies, ordering and review of radiographic studies, pulse oximetry and re-evaluation of patient's condition.   Initial Impression / Assessment and Plan / ED Course  I have reviewed the triage vital signs and the nursing notes.  Pertinent labs & imaging results that were available during my care of the patient were reviewed by me and considered in my medical decision making (see chart for details).  Clinical Course      Final Clinical Impressions(s) / ED Diagnoses   Final diagnoses:  None  Patient will be admitted for new onset atrial flutter and lower GI bleed  New Prescriptions New Prescriptions   No medications on file   The chart was scribed for me under my direct supervision.  I personally performed the  history, physical, and medical decision making and all procedures in the evaluation of this patient.Bethann Berkshire, MD 05/13/16 2253

## 2016-05-13 NOTE — ED Notes (Signed)
EDP at the bedside, wife in the room

## 2016-05-14 ENCOUNTER — Encounter (HOSPITAL_COMMUNITY): Payer: Self-pay

## 2016-05-14 DIAGNOSIS — K922 Gastrointestinal hemorrhage, unspecified: Secondary | ICD-10-CM

## 2016-05-14 DIAGNOSIS — D539 Nutritional anemia, unspecified: Secondary | ICD-10-CM | POA: Diagnosis present

## 2016-05-14 LAB — FOLATE: Folate: 11.4 ng/mL (ref >5.9)

## 2016-05-14 LAB — CBC
HEMATOCRIT: 30.2 % — AB (ref 39.0–52.0)
HEMOGLOBIN: 10.3 g/dL — AB (ref 13.0–17.0)
MCH: 37.1 pg — AB (ref 26.0–34.0)
MCHC: 34.1 g/dL (ref 30.0–36.0)
MCV: 108.6 fL — ABNORMAL HIGH (ref 78.0–100.0)
Platelets: 175 10*3/uL (ref 150–400)
RBC: 2.78 MIL/uL — AB (ref 4.22–5.81)
RDW: 15 % (ref 11.5–15.5)
WBC: 19.6 10*3/uL — ABNORMAL HIGH (ref 4.0–10.5)

## 2016-05-14 LAB — IRON AND TIBC
Iron: 75 ug/dL (ref 45–182)
SATURATION RATIOS: 29 % (ref 17.9–39.5)
TIBC: 260 ug/dL (ref 250–450)
UIBC: 185 ug/dL

## 2016-05-14 LAB — RETICULOCYTES
RBC.: 2.79 MIL/uL — AB (ref 4.22–5.81)
RETIC CT PCT: 3.1 % (ref 0.4–3.1)
Retic Count, Absolute: 86.5 10*3/uL (ref 19.0–186.0)

## 2016-05-14 LAB — FERRITIN: Ferritin: 294 ng/mL (ref 24–336)

## 2016-05-14 LAB — VITAMIN B12: Vitamin B-12: 206 pg/mL (ref 180–914)

## 2016-05-14 MED ORDER — PRAVASTATIN SODIUM 10 MG PO TABS
20.0000 mg | ORAL_TABLET | Freq: Every day | ORAL | Status: DC
  Administered 2016-05-15: 20 mg via ORAL
  Filled 2016-05-14 (×3): qty 2

## 2016-05-14 MED ORDER — ALBUTEROL SULFATE (2.5 MG/3ML) 0.083% IN NEBU: 3.0000 mL | INHALATION_SOLUTION | RESPIRATORY_TRACT | Status: DC | PRN

## 2016-05-14 MED ORDER — PANTOPRAZOLE SODIUM 40 MG PO TBEC
40.0000 mg | DELAYED_RELEASE_TABLET | Freq: Every day | ORAL | Status: DC
  Administered 2016-05-14 – 2016-05-16 (×3): 40 mg via ORAL
  Filled 2016-05-14 (×3): qty 1

## 2016-05-14 MED ORDER — SODIUM CHLORIDE 0.9 % IV SOLN
INTRAVENOUS | Status: DC
  Administered 2016-05-14: 1000 mL via INTRAVENOUS
  Administered 2016-05-15: 05:00:00 via INTRAVENOUS

## 2016-05-14 MED ORDER — DEXTROSE-NACL 5-0.9 % IV SOLN
INTRAVENOUS | Status: DC
  Administered 2016-05-14: 02:00:00 via INTRAVENOUS

## 2016-05-14 MED ORDER — HYDROCORTISONE NA SUCCINATE PF 100 MG IJ SOLR
50.0000 mg | Freq: Three times a day (TID) | INTRAMUSCULAR | Status: DC
  Administered 2016-05-14: 50 mg via INTRAVENOUS
  Filled 2016-05-14 (×2): qty 2

## 2016-05-14 MED ORDER — GEMFIBROZIL 600 MG PO TABS
600.0000 mg | ORAL_TABLET | Freq: Two times a day (BID) | ORAL | Status: DC
  Administered 2016-05-15: 600 mg via ORAL
  Filled 2016-05-14 (×3): qty 1

## 2016-05-14 MED ORDER — PREDNISONE 20 MG PO TABS: 20.0000 mg | ORAL_TABLET | Freq: Every day | ORAL | Status: DC

## 2016-05-14 MED ORDER — PREDNISONE 20 MG PO TABS
20.0000 mg | ORAL_TABLET | Freq: Every day | ORAL | Status: DC
  Administered 2016-05-14: 20 mg via ORAL
  Filled 2016-05-14 (×2): qty 1

## 2016-05-14 MED ORDER — NINTEDANIB ESYLATE 150 MG PO CAPS
150.0000 mg | ORAL_CAPSULE | Freq: Two times a day (BID) | ORAL | Status: DC
  Administered 2016-05-14 – 2016-05-16 (×4): 150 mg via ORAL

## 2016-05-14 MED ORDER — ALLOPURINOL 300 MG PO TABS
300.0000 mg | ORAL_TABLET | Freq: Every day | ORAL | Status: DC
  Administered 2016-05-14: 300 mg via ORAL
  Filled 2016-05-14 (×2): qty 1

## 2016-05-14 MED ORDER — SODIUM CHLORIDE 0.9% FLUSH
3.0000 mL | Freq: Two times a day (BID) | INTRAVENOUS | Status: DC
  Administered 2016-05-14 – 2016-05-15 (×5): 3 mL via INTRAVENOUS

## 2016-05-14 MED ORDER — NINTEDANIB ESYLATE 150 MG PO CAPS
150.0000 mg | ORAL_CAPSULE | Freq: Two times a day (BID) | ORAL | Status: DC
  Filled 2016-05-14: qty 1

## 2016-05-14 NOTE — ED Notes (Signed)
Delay in admission explained to pt and family at bedside,

## 2016-05-14 NOTE — Consult Note (Signed)
Referring Provider: Carylon Perches, MD Primary Care Physician:  Carylon Perches, MD Primary Gastroenterologist:  Roetta Sessions, MD  Reason for Consultation:  GI bleeding  HPI: ODAI Charles [name] is a 82 y.o. male with history of pulmonary fibrosis on chronic steroids/home O2, hypertension who presents with acute onset painless hematochezia. Brought to the ER via EMS for GI bleeding of 2 hours. Patient had 2 episodes of dark red blood per rectum. O2 sats 84% at home 3 L. Patient states he does not take any aspirin. He has not been on aspirin ever one year and he requested aspirin to be taken off of his medication list. He is not on any other blood thinners. Generally he has no GI complaints. Denies heartburn, appetite concerns. No dysphagia. Bowel function is usually regular. No chronic GI bleeding. No melena. No abdominal pain.  Last colonoscopy 2008, tubular adenomas removed. Patient had diverticulosis at the time. Patient received a letter for surveillance colonoscopy in 2013 but at that time he had a friend who had recently suffered a perforation related to colonoscopy and he decided not to have another one. 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 He states if he were to have critical bleeding, he may reconsider.   Prior to Admission medications   Medication Sig Start Date End Date Taking? Authorizing Provider  allopurinol (ZYLOPRIM) 300 MG tablet Take 300 mg by mouth daily.  12/01/11  Yes Historical Provider, MD  amLODipine (NORVASC) 10 MG tablet Take 10 mg by mouth daily.  06/16/14  Yes Historical Provider, MD        Historical Provider, MD  losartan (COZAAR) 100 MG tablet Take 100 mg by mouth daily. Once daily 06/16/14  Yes Historical Provider, MD  lovastatin (MEVACOR) 10 MG tablet Take 10 mg by mouth at bedtime.   Yes Historical Provider, MD  Nintedanib (OFEV) 150 MG CAPS Take 150 mg by mouth 2 (two) times daily.   Yes  Historical Provider, MD  predniSONE (DELTASONE) 20 MG tablet Take 20 mg by mouth daily with breakfast.   Yes Historical Provider, MD    Current Facility-Administered Medications  Medication Dose Route Frequency Provider Last Rate Last Dose  . albuterol (PROVENTIL) (2.5 MG/3ML) 0.083% nebulizer solution 3 mL  3 mL Inhalation Q4H PRN Houston Siren, MD      . allopurinol (ZYLOPRIM) tablet 300 mg  300 mg Oral Daily Houston Siren, MD      . dextrose 5 %-0.9 % sodium chloride infusion   Intravenous Continuous Houston Siren, MD 50 mL/hr at 05/14/16 0159    . gemfibrozil (LOPID) tablet 600 mg  600 mg Oral BID AC Houston Siren, MD      . Nintedanib (OFEV) CAPS 150 mg  150 mg Oral BID Houston Siren, MD      . pantoprazole (PROTONIX) EC tablet 40 mg  40 mg Oral Q0600 Houston Siren, MD   40 mg at 05/14/16 0531  . pravastatin (PRAVACHOL) tablet 20 mg  20 mg Oral q1800 Houston Siren, MD      . Melene Muller ON 05/20/2016] predniSONE (DELTASONE) tablet 20 mg  20 mg Oral QAC breakfast Houston Siren, MD      . sodium chloride flush (NS) 0.9 % injection 3 mL  3 mL Intravenous Q12H Houston Siren, MD   3 mL at 05/14/16 0130    Allergies as of 05/13/2016  . (No Known Allergies)    Past Medical History:  Diagnosis Date  . Dyspnea   . Heart murmur   . Hypercholesteremia   . Hypertension   . Pulmonary fibrosis (HCC)   . Spontaneous pneumothorax 11/23/2011   Right Side    Past Surgical History:  Procedure Laterality Date  . CHEST TUBE INSERTION  11/20/2011  . COLONOSCOPY  2008   Dr. Jena Gauss: diverticulosis and cecal adenomas.   . TONSILLECTOMY    . VIDEO ASSISTED THORACOSCOPY  11/25/2011   Procedure: VIDEO ASSISTED THORACOSCOPY;  Surgeon: Purcell Nails, MD;  Location: [facility] OR;  Service: Thoracic;  Laterality: Right;  (R) VATS W/STAPLEING OF BLEB    Family History  Problem Relation Age of Onset  . Pulmonary fibrosis Brother     twin brother    Social History   Social History  . Marital status: Married    Spouse name: N/A  . Number of children: 3   . Years of education: N/A   Occupational History  . retired    Social History Main Topics  . Smoking status: Former Smoker    Packs/day: 1.50    Years: 22.00    Types: Cigarettes    Quit date: 11/18/1983  . Smokeless tobacco: Never Used  . Alcohol use Yes     Comment: 1 beer and 2 oz. liqour nightly  . Drug use: No  . Sexual activity: Not on file   Other Topics Concern  . Not on file   Social History Narrative  . No narrative on file     ROS:  General: Negative for anorexia, weight loss, fever, chills, fatigue, weakness. Eyes: Negative for vision changes.  ENT: Negative for hoarseness, difficulty swallowing , nasal congestion. CV: Negative for chest pain, angina, palpitations, peripheral edema. Chronic DOE Respiratory: Negative for dyspnea at rest, cough, sputum, wheezing. Chronic DOE GI: See history of present illness. GU:  Negative for dysuria, hematuria, urinary incontinence, urinary frequency, nocturnal urination.  MS: Negative for joint pain, low back pain.  Derm: Negative for rash or itching.  Neuro: Negative for weakness, abnormal sensation, seizure, frequent headaches, memory loss, confusion.  Psych: Negative for anxiety, depression, suicidal ideation, hallucinations.  Endo: Negative for unusual weight change.  Heme: Negative for bruising or bleeding. Allergy: Negative for rash or hives.       Physical Examination: Vital signs in last 24 hours: Temp:  [97.3 F (36.3 C)-97.6 F (36.4 C)] 97.4 F (36.3 C) (10/05 0617) Pulse Rate:  [52-121] 94 (10/05 0617) Resp:  [14-43] 20 (10/05 0617) BP: (86-120)/(45-69) 101/60 (10/05 0617) SpO2:  [92 %-98 %] 93 % (10/05 0617) Weight:  [195 lb (88.5 kg)-196 lb 8 oz (89.1 kg)] 196 lb 8 oz (89.1 kg) (10/05 0130) Last BM Date: 05/13/16  General: Well-nourished, well-developed in no acute distress. Appears very comfortable. Head: Normocephalic, atraumatic.   Eyes: Conjunctiva pink, no icterus. Mouth: Oropharyngeal mucosa  moist and pink , no lesions erythema or exudate. Neck: Supple without thyromegaly, masses, or lymphadenopathy.  Lungs: Clear to auscultation bilaterally.  Heart: Regular rate and rhythm, no murmurs rubs or gallops.  Abdomen: Bowel sounds are normal, nontender, nondistended, no hepatosplenomegaly or masses, no abdominal bruits or    hernia , no rebound or guarding.   Rectal: In the ER, bright red blood noted during rectal exam Extremities: 1+bilateral lower extremity edema. No clubbing, deformity.  Neuro: Alert and oriented x 4 , grossly normal neurologically.  Skin: Warm and dry, no rash or jaundice.   Psych: Alert and cooperative, normal mood and affect.  Intake/Output from previous day: 10/04 0701 - 10/05 0700 In: 1600.8 [I.V.:200.8; IV Piggyback:1400] Out: -  Intake/Output this shift: No intake/output data recorded.  Lab Results: CBC  Recent Labs  05/13/16 2145 05/13/16 2146 05/14/16 0717  WBC 22.5*  --  19.6*  HGB 11.5* 11.9* 10.3*  HCT 33.8* 35.0* 30.2*  MCV 111.2*  --  108.6*  PLT 200  --  175   BMET  Recent Labs  05/13/16 2145 05/13/16 2146  NA 137 139  K 4.3 4.2  CL 107 104  CO2 15*  --   GLUCOSE 222* 210*  BUN 65* 61*  CREATININE 1.59* 1.40*  CALCIUM 8.0*  --    LFT  Recent Labs  05/13/16 2145  BILITOT 0.9  ALKPHOS 61  AST 16  ALT 14*  PROT 5.2*  ALBUMIN 2.9*    Lipase No results for input(s): LIPASE in the last 72 hours.  PT/INR  Recent Labs  05/13/16 2145  LABPROT 14.0  INR 1.07      Imaging Studies: Dg Chest Portable 1 View  Result Date: 05/14/2016 CLINICAL DATA:  Shortness of breath. GI bleeding. History of pulmonary fibrosis and hypertension. EXAM: PORTABLE CHEST 1 VIEW COMPARISON:  CT chest 06/07/2014.  Chest 06/04/2014. FINDINGS: Shallow inspiration. Cardiac enlargement with normal pulmonary vascularity. Diffuse interstitial pattern in the lung periphery in bases consistent with usual interstitial pneumonitis. No focal  consolidation or airspace disease. No blunting of costophrenic angles. No pneumothorax. IMPRESSION: Low lung volumes with interstitial fibrosis consistent with usual interstitial pneumonitis. No focal consolidation. Electronically Signed   By: Burman NievesWilliam  Stevens M.D.   On: 05/14/2016 00:54  [4 week]   Impression: 82 year old gentleman with history of pulmonary fibrosis on chronic steroids/oxygen, hypertension who presents with 2 episodes of painless hematochezia. No bleeding noted since admission. Hemoglobin on admission 11.5 down to 10.3 today. His MCV is significantly elevated, anemia panel pending. Also noted to have elevated BUN and creatinine. Baseline hemoglobin unknown to me. He had anemia and renal insufficiency back in 2013, cannot exclude chronic renal disease. No report of melena. Patient reports modest daily alcohol consumption. Platelet count and INR unremarkable. Do not suspect advanced liver disease or esophageal variceal bleed. Likely patient has diverticular bleed. He also has history of tubular adenomas, never received a surveillance colonoscopy per his choice.  Discussed at length with patient today. Suspect diverticular bleed which is resolved. Likely some degree of chronic anemia, agree with anemia panel given elevated MCV as well. Patient this point is refusing colonoscopy. He states he may reconsider if he were to have life-threatening bleeding. He does not plan to have surveillance colonoscopies for history of precancerous polyps.  Leukocytosis, improving.? Related to chronic steroids.  Plan: 1. Follow-up pending labs. 2. Monitor for further GI bleeding.  3. Continue clear liquid diet for now in case he rebleeds. Consider advancing tomorrow or this evening.   We would like to thank you for the opportunity to participate in the care of Solon Augustaichard H Kimberley.  Leanna BattlesLeslie S. Kelli ChurnLewis, PA-C [facility] [phone] 10/5/20179:05 AM     LOS: 1 day

## 2016-05-14 NOTE — Progress Notes (Signed)
Subjective: He denies further bleeding this morning. He is not experiencing abdominal pain.  Objective: Vital signs in last 24 hours: Vitals:   05/14/16 0000 05/14/16 0030 05/14/16 0130 05/14/16 0617  BP: 105/58 106/62 120/65 101/60  Pulse: 79 96 60 94  Resp: 26 24 20 20   Temp:   97.6 F (36.4 C) 97.4 F (36.3 C)  TempSrc:   Oral Oral  SpO2: 96% 98% 93% 93%  Weight:   196 lb 8 oz (89.1 kg)   Height:   5\' 10"  (1.778 m)    Weight change:   Intake/Output Summary (Last 24 hours) at 05/14/16 0749 Last data filed at 05/14/16 0600  Gross per 24 hour  Intake          1600.83 ml  Output                0 ml  Net          1600.83 ml    Physical Exam: Alert. No distress. Lungs reveal bilateral crackles. Heart tachycardic to 108 and irregular. Abdomen soft and nontender with no hepatosplenomegaly. Neuro stable.  Lab Results:    Results for orders placed or performed during the hospital encounter of 05/13/16 (from the past 24 hour(s))  POC occult blood, ED     Status: Abnormal   Collection Time: 05/13/16  9:41 PM  Result Value Ref Range   Fecal Occult Bld POSITIVE (A) NEGATIVE  CBC with Differential/Platelet     Status: Abnormal   Collection Time: 05/13/16  9:45 PM  Result Value Ref Range   WBC 22.5 (H) 4.0 - 10.5 K/uL   RBC 3.04 (L) 4.22 - 5.81 MIL/uL   Hemoglobin 11.5 (L) 13.0 - 17.0 g/dL   HCT 16.1 (L) 09.6 - 04.5 %   MCV 111.2 (H) 78.0 - 100.0 fL   MCH 37.8 (H) 26.0 - 34.0 pg   MCHC 34.0 30.0 - 36.0 g/dL   RDW 40.9 81.1 - 91.4 %   Platelets 200 150 - 400 K/uL   Neutrophils Relative % 92 %   Neutro Abs 20.4 (H) 1.7 - 7.7 K/uL   Lymphocytes Relative 5 %   Lymphs Abs 1.2 0.7 - 4.0 K/uL   Monocytes Relative 2 %   Monocytes Absolute 0.5 0.1 - 1.0 K/uL   Eosinophils Relative 0 %   Eosinophils Absolute 0.0 0.0 - 0.7 K/uL   Basophils Relative 0 %   Basophils Absolute 0.0 0.0 - 0.1 K/uL   WBC Morphology WHITE COUNT CONFIRMED ON SMEAR   Comprehensive metabolic panel      Status: Abnormal   Collection Time: 05/13/16  9:45 PM  Result Value Ref Range   Sodium 137 135 - 145 mmol/L   Potassium 4.3 3.5 - 5.1 mmol/L   Chloride 107 101 - 111 mmol/L   CO2 15 (L) 22 - 32 mmol/L   Glucose, Bld 222 (H) 65 - 99 mg/dL   BUN 65 (H) 6 - 20 mg/dL   Creatinine, Ser 7.82 (H) 0.61 - 1.24 mg/dL   Calcium 8.0 (L) 8.9 - 10.3 mg/dL   Total Protein 5.2 (L) 6.5 - 8.1 g/dL   Albumin 2.9 (L) 3.5 - 5.0 g/dL   AST 16 15 - 41 U/L   ALT 14 (L) 17 - 63 U/L   Alkaline Phosphatase 61 38 - 126 U/L   Total Bilirubin 0.9 0.3 - 1.2 mg/dL   GFR calc non Af Amer 39 (L) >60 mL/min   GFR calc Af Denyse Dago  45 (L) >60 mL/min   Anion gap 15 5 - 15  Type and screen     Status: None   Collection Time: 05/13/16  9:45 PM  Result Value Ref Range   ABO/RH(D) O POS    Antibody Screen NEG    Sample Expiration 05/16/2016   Protime-INR     Status: None   Collection Time: 05/13/16  9:45 PM  Result Value Ref Range   Prothrombin Time 14.0 11.4 - 15.2 seconds   INR 1.07   Troponin I     Status: Abnormal   Collection Time: 05/13/16  9:45 PM  Result Value Ref Range   Troponin I 0.03 (HH) <0.03 ng/mL  Brain natriuretic peptide     Status: Abnormal   Collection Time: 05/13/16  9:45 PM  Result Value Ref Range   B Natriuretic Peptide 196.0 (H) 0.0 - 100.0 pg/mL  I-stat chem 8, ed     Status: Abnormal   Collection Time: 05/13/16  9:46 PM  Result Value Ref Range   Sodium 139 135 - 145 mmol/L   Potassium 4.2 3.5 - 5.1 mmol/L   Chloride 104 101 - 111 mmol/L   BUN 61 (H) 6 - 20 mg/dL   Creatinine, Ser 6.211.40 (H) 0.61 - 1.24 mg/dL   Glucose, Bld 308210 (H) 65 - 99 mg/dL   Calcium, Ion 6.571.08 (L) 1.15 - 1.40 mmol/L   TCO2 16 0 - 100 mmol/L   Hemoglobin 11.9 (L) 13.0 - 17.0 g/dL   HCT 84.635.0 (L) 96.239.0 - 95.252.0 %  CBC     Status: Abnormal   Collection Time: 05/14/16  7:17 AM  Result Value Ref Range   WBC 19.6 (H) 4.0 - 10.5 K/uL   RBC 2.78 (L) 4.22 - 5.81 MIL/uL   Hemoglobin 10.3 (L) 13.0 - 17.0 g/dL   HCT 84.130.2 (L)  32.439.0 - 52.0 %   MCV 108.6 (H) 78.0 - 100.0 fL   MCH 37.1 (H) 26.0 - 34.0 pg   MCHC 34.1 30.0 - 36.0 g/dL   RDW 40.115.0 02.711.5 - 25.315.5 %   Platelets 175 150 - 400 K/uL     ABGS  Recent Labs  05/13/16 2146  TCO2 16   CULTURES No results found for this or any previous visit (from the past 240 hour(s)). Studies/Results: Dg Chest Portable 1 View  Result Date: 05/14/2016 CLINICAL DATA:  Shortness of breath. GI bleeding. History of pulmonary fibrosis and hypertension. EXAM: PORTABLE CHEST 1 VIEW COMPARISON:  CT chest 06/07/2014.  Chest 06/04/2014. FINDINGS: Shallow inspiration. Cardiac enlargement with normal pulmonary vascularity. Diffuse interstitial pattern in the lung periphery in bases consistent with usual interstitial pneumonitis. No focal consolidation or airspace disease. No blunting of costophrenic angles. No pneumothorax. IMPRESSION: Low lung volumes with interstitial fibrosis consistent with usual interstitial pneumonitis. No focal consolidation. Electronically Signed   By: Burman NievesWilliam  Stevens M.D.   On: 05/14/2016 00:54   Micro Results: No results found for this or any previous visit (from the past 240 hour(s)). Studies/Results: Dg Chest Portable 1 View  Result Date: 05/14/2016 CLINICAL DATA:  Shortness of breath. GI bleeding. History of pulmonary fibrosis and hypertension. EXAM: PORTABLE CHEST 1 VIEW COMPARISON:  CT chest 06/07/2014.  Chest 06/04/2014. FINDINGS: Shallow inspiration. Cardiac enlargement with normal pulmonary vascularity. Diffuse interstitial pattern in the lung periphery in bases consistent with usual interstitial pneumonitis. No focal consolidation or airspace disease. No blunting of costophrenic angles. No pneumothorax. IMPRESSION: Low lung volumes with interstitial fibrosis consistent with usual interstitial  pneumonitis. No focal consolidation. Electronically Signed   By: Burman Nieves M.D.   On: 05/14/2016 00:54   Medications:  I have reviewed the patient's current  medications Scheduled Meds: . allopurinol  300 mg Oral Daily  . gemfibrozil  600 mg Oral BID AC  . Nintedanib  150 mg Oral BID  . pantoprazole  40 mg Oral Q0600  . pravastatin  20 mg Oral q1800  . [START ON 05/20/2016] predniSONE  20 mg Oral QAC breakfast  . sodium chloride flush  3 mL Intravenous Q12H   Continuous Infusions: . sodium chloride     PRN Meds:.albuterol   Assessment/Plan: #1. GI bleed. Repeat hemoglobin is 10.3 with an MCV of 108. He is hesitant to proceed with any endoscopic evaluation. He has a history of diverticulosis. #2. Pulmonary fibrosis. Continue current treatment including supple oxygen. #3. Macrocytosis. Check vitamin B-12 level and folic acid level. #4. Chronic kidney disease. Creatinine 1.4. He has recently required Lasix for lower extremity edema. #5. Hyperglycemia. He is receiving prednisone 20 mg daily and received hydrocortisone and dextrose containing fluids on admission. Switch to normal saline. Discontinue further doses of hydrocortisone but continue with current prednisone dose. Recheck glucose. #6. Irregular heart rhythm. Telemetry is somewhat difficult to interpret due to artifact. It appears he was in sinus tachycardia on his admission EKG. Repeat EKG. Active Problems:   Hypertension   Hyperlipidemia   Postinflammatory pulmonary fibrosis (HCC)   Lower GI bleed   PAC (premature atrial contraction)   GI bleed     LOS: 1 day   Charon Smedberg 05/14/2016, 7:49 AM

## 2016-05-14 NOTE — Care Management Note (Signed)
Case Management Note  Patient Details  Name: Solon AugustaRichard H Sanguinetti MRN: [id number] Date of Birth: 11-08-1932  Subjective/Objective:                  Pt admitted with GIB. Pt is from home, lives with wife. He is ind with ADL's. He has home O2 PTA he wears continuously at 3lpm. He has a neb machine at home. He uses not assistive devices for mobility. He drives himself to appointments. He plans to return home with self care. He plans to return home with self care at DC.   Action/Plan: No CM needs anticipated.   Expected Discharge Date:     05/16/2016             Expected Discharge Plan:  Home/Self Care  In-House Referral:  NA  Discharge planning Services  CM Consult  Post Acute Care Choice:  NA Choice offered to:  NA  DME Arranged:    DME Agency:     HH Arranged:    HH Agency:     Status of Service:  Completed, signed off  If discussed at [employer] Length of Stay Meetings, dates discussed:    Additional Comments:  Malcolm MetroChildress, Tyresse Jayson Demske, RN 05/14/2016, 2:28 PM

## 2016-05-15 LAB — BASIC METABOLIC PANEL
ANION GAP: 8 (ref 5–15)
BUN: 48 mg/dL — ABNORMAL HIGH (ref 6–20)
CHLORIDE: 110 mmol/L (ref 101–111)
CO2: 20 mmol/L — ABNORMAL LOW (ref 22–32)
Calcium: 8.3 mg/dL — ABNORMAL LOW (ref 8.9–10.3)
Creatinine, Ser: 1.28 mg/dL — ABNORMAL HIGH (ref 0.61–1.24)
GFR, EST AFRICAN AMERICAN: 59 mL/min — AB (ref >60)
GFR, EST NON AFRICAN AMERICAN: 51 mL/min — AB (ref >60)
Glucose, Bld: 91 mg/dL (ref 65–99)
POTASSIUM: 4 mmol/L (ref 3.5–5.1)
SODIUM: 138 mmol/L (ref 135–145)

## 2016-05-15 LAB — HEMOGLOBIN AND HEMATOCRIT, BLOOD
HCT: 28.2 % — ABNORMAL LOW (ref 39.0–52.0)
HEMOGLOBIN: 9.5 g/dL — AB (ref 13.0–17.0)

## 2016-05-15 LAB — CBC
HEMATOCRIT: 30.3 % — AB (ref 39.0–52.0)
HEMOGLOBIN: 10.3 g/dL — AB (ref 13.0–17.0)
MCH: 37.5 pg — AB (ref 26.0–34.0)
MCHC: 34 g/dL (ref 30.0–36.0)
MCV: 110.2 fL — AB (ref 78.0–100.0)
PLATELETS: 188 10*3/uL (ref 150–400)
RBC: 2.75 MIL/uL — AB (ref 4.22–5.81)
RDW: 15.5 % (ref 11.5–15.5)
WBC: 23.4 10*3/uL — AB (ref 4.0–10.5)

## 2016-05-15 MED ORDER — PREDNISONE 20 MG PO TABS: 20.0000 mg | ORAL_TABLET | Freq: Every day | ORAL | Status: DC

## 2016-05-15 MED ORDER — ALLOPURINOL 300 MG PO TABS
300.0000 mg | ORAL_TABLET | Freq: Every day | ORAL | Status: DC
  Administered 2016-05-15: 300 mg via ORAL
  Filled 2016-05-15: qty 1

## 2016-05-15 MED ORDER — ZOLPIDEM TARTRATE 5 MG PO TABS
5.0000 mg | ORAL_TABLET | Freq: Every evening | ORAL | Status: DC | PRN
  Administered 2016-05-15: 5 mg via ORAL
  Filled 2016-05-15: qty 1

## 2016-05-15 MED ORDER — ALLOPURINOL 300 MG PO TABS: 300.0000 mg | ORAL_TABLET | Freq: Every morning | ORAL | Status: DC

## 2016-05-15 MED ORDER — PREDNISONE 20 MG PO TABS
20.0000 mg | ORAL_TABLET | Freq: Every day | ORAL | Status: DC
  Administered 2016-05-15: 20 mg via ORAL
  Filled 2016-05-15: qty 1

## 2016-05-15 NOTE — Care Management Important Message (Signed)
Important Message  Patient Details  Name: Charles [name] MRN: [id number] Date of Birth: [birth year]   Medicare Important Message Given:  Yes    Malcolm Metrohildress, Betzaira Mentel Demske, RN 05/15/2016, 7:42 AM

## 2016-05-15 NOTE — Progress Notes (Signed)
Subjective: He says he feels better. He is still short of breath. His white blood count is up and I suspected that is related to the IV steroids he received when he was admitted. He had a bowel movement with some blood yesterday but normal since  Objective: Vital signs in last 24 hours: Temp:  [97.4 F (36.3 C)-98.4 F (36.9 C)] 97.4 F (36.3 C) (10/06 0520) Pulse Rate:  [84-100] 91 (10/06 0520) Resp:  [20] 20 (10/06 0520) BP: (109-123)/(51-78) 118/69 (10/06 0520) SpO2:  [94 %-96 %] 94 % (10/06 0520) Weight change:  Last BM Date: 05/14/16  Intake/Output from previous day: 10/05 0701 - 10/06 0700 In: 1240 [P.O.:240; I.V.:1000] Out: 400 [Urine:400]  PHYSICAL EXAM General appearance: alert, cooperative and mild distress Resp: Chronic crackles Cardio: regular rate and rhythm, S1, S2 normal, no murmur, click, rub or gallop GI: soft, non-tender; bowel sounds normal; no masses,  no organomegaly Extremities: extremities normal, atraumatic, no cyanosis or edema  Lab Results:  Results for orders placed or performed during the hospital encounter of 05/13/16 (from the past 48 hour(s))  POC occult blood, ED     Status: Abnormal   Collection Time: 05/13/16  9:41 PM  Result Value Ref Range   Fecal Occult Bld POSITIVE (A) NEGATIVE  CBC with Differential/Platelet     Status: Abnormal   Collection Time: 05/13/16  9:45 PM  Result Value Ref Range   WBC 22.5 (H) 4.0 - 10.5 K/uL   RBC 3.04 (L) 4.22 - 5.81 MIL/uL   Hemoglobin 11.5 (L) 13.0 - 17.0 g/dL   HCT 33.8 (L) 39.0 - 52.0 %   MCV 111.2 (H) 78.0 - 100.0 fL   MCH 37.8 (H) 26.0 - 34.0 pg   MCHC 34.0 30.0 - 36.0 g/dL   RDW 15.1 11.5 - 15.5 %   Platelets 200 150 - 400 K/uL    Comment: SPECIMEN CHECKED FOR CLOTS PLATELET COUNT CONFIRMED BY SMEAR LARGE PLATELETS PRESENT GIANT PLATELETS SEEN    Neutrophils Relative % 92 %   Neutro Abs 20.4 (H) 1.7 - 7.7 K/uL   Lymphocytes Relative 5 %   Lymphs Abs 1.2 0.7 - 4.0 K/uL   Monocytes  Relative 2 %   Monocytes Absolute 0.5 0.1 - 1.0 K/uL   Eosinophils Relative 0 %   Eosinophils Absolute 0.0 0.0 - 0.7 K/uL   Basophils Relative 0 %   Basophils Absolute 0.0 0.0 - 0.1 K/uL   WBC Morphology WHITE COUNT CONFIRMED ON SMEAR     Comment: MILD LEFT SHIFT (1-5% METAS, OCC MYELO, OCC BANDS)  Comprehensive metabolic panel     Status: Abnormal   Collection Time: 05/13/16  9:45 PM  Result Value Ref Range   Sodium 137 135 - 145 mmol/L   Potassium 4.3 3.5 - 5.1 mmol/L   Chloride 107 101 - 111 mmol/L   CO2 15 (L) 22 - 32 mmol/L   Glucose, Bld 222 (H) 65 - 99 mg/dL   BUN 65 (H) 6 - 20 mg/dL   Creatinine, Ser 1.59 (H) 0.61 - 1.24 mg/dL   Calcium 8.0 (L) 8.9 - 10.3 mg/dL   Total Protein 5.2 (L) 6.5 - 8.1 g/dL   Albumin 2.9 (L) 3.5 - 5.0 g/dL   AST 16 15 - 41 U/L   ALT 14 (L) 17 - 63 U/L   Alkaline Phosphatase 61 38 - 126 U/L   Total Bilirubin 0.9 0.3 - 1.2 mg/dL   GFR calc non Af Amer 39 (L) >60 mL/min  GFR calc Af Amer 45 (L) >60 mL/min    Comment: (NOTE) The eGFR has been calculated using the CKD EPI equation. This calculation has not been validated in all clinical situations. eGFR's persistently <60 mL/min signify possible Chronic Kidney Disease.    Anion gap 15 5 - 15  Type and screen     Status: None   Collection Time: 05/13/16  9:45 PM  Result Value Ref Range   ABO/RH(D) O POS    Antibody Screen NEG    Sample Expiration 05/16/2016   Protime-INR     Status: None   Collection Time: 05/13/16  9:45 PM  Result Value Ref Range   Prothrombin Time 14.0 11.4 - 15.2 seconds   INR 1.07   Troponin I     Status: Abnormal   Collection Time: 05/13/16  9:45 PM  Result Value Ref Range   Troponin I 0.03 (HH) <0.03 ng/mL    Comment: CRITICAL RESULT CALLED TO, READ BACK BY AND VERIFIED WITH: COCKHRAM,R ON 05/13/16 AT 2250 BY LOY,C   Brain natriuretic peptide     Status: Abnormal   Collection Time: 05/13/16  9:45 PM  Result Value Ref Range   B Natriuretic Peptide 196.0 (H) 0.0 -  100.0 pg/mL  I-stat chem 8, ed     Status: Abnormal   Collection Time: 05/13/16  9:46 PM  Result Value Ref Range   Sodium 139 135 - 145 mmol/L   Potassium 4.2 3.5 - 5.1 mmol/L   Chloride 104 101 - 111 mmol/L   BUN 61 (H) 6 - 20 mg/dL   Creatinine, Ser 1.40 (H) 0.61 - 1.24 mg/dL   Glucose, Bld 210 (H) 65 - 99 mg/dL   Calcium, Ion 1.08 (L) 1.15 - 1.40 mmol/L   TCO2 16 0 - 100 mmol/L   Hemoglobin 11.9 (L) 13.0 - 17.0 g/dL   HCT 35.0 (L) 39.0 - 52.0 %  CBC     Status: Abnormal   Collection Time: 05/14/16  7:17 AM  Result Value Ref Range   WBC 19.6 (H) 4.0 - 10.5 K/uL   RBC 2.78 (L) 4.22 - 5.81 MIL/uL   Hemoglobin 10.3 (L) 13.0 - 17.0 g/dL   HCT 30.2 (L) 39.0 - 52.0 %   MCV 108.6 (H) 78.0 - 100.0 fL   MCH 37.1 (H) 26.0 - 34.0 pg   MCHC 34.1 30.0 - 36.0 g/dL   RDW 15.0 11.5 - 15.5 %   Platelets 175 150 - 400 K/uL  Reticulocytes     Status: Abnormal   Collection Time: 05/14/16  7:17 AM  Result Value Ref Range   Retic Ct Pct 3.1 0.4 - 3.1 %   RBC. 2.79 (L) 4.22 - 5.81 MIL/uL   Retic Count, Manual 86.5 19.0 - 186.0 K/uL  Vitamin B12     Status: None   Collection Time: 05/14/16  9:37 AM  Result Value Ref Range   Vitamin B-12 206 180 - 914 pg/mL    Comment: (NOTE) This assay is not validated for testing neonatal or myeloproliferative syndrome specimens for Vitamin B12 levels. Performed at [facility]   Iron and TIBC     Status: None   Collection Time: 05/14/16  9:37 AM  Result Value Ref Range   Iron 75 45 - 182 ug/dL   TIBC 260 250 - 450 ug/dL   Saturation Ratios 29 17.9 - 39.5 %   UIBC 185 ug/dL    Comment: Performed at [facility]  Ferritin  Status: None   Collection Time: 05/14/16  9:37 AM  Result Value Ref Range   Ferritin 294 24 - 336 ng/mL    Comment: Performed at [facility]  Folate     Status: None   Collection Time: 05/14/16  9:39 AM  Result Value Ref Range   Folate 11.4 >5.9 ng/mL    Comment: Performed at [place]  metabolic panel     Status: Abnormal   Collection Time: 05/15/16  5:51 AM  Result Value Ref Range   Sodium 138 135 - 145 mmol/L   Potassium 4.0 3.5 - 5.1 mmol/L   Chloride 110 101 - 111 mmol/L   CO2 20 (L) 22 - 32 mmol/L   Glucose, Bld 91 65 - 99 mg/dL   BUN 48 (H) 6 - 20 mg/dL   Creatinine, Ser 1.28 (H) 0.61 - 1.24 mg/dL   Calcium 8.3 (L) 8.9 - 10.3 mg/dL   GFR calc non Af Amer 51 (L) >60 mL/min   GFR calc Af Amer 59 (L) >60 mL/min    Comment: (NOTE) The eGFR has been calculated using the CKD EPI equation. This calculation has not been validated in all clinical situations. eGFR's persistently <60 mL/min signify possible Chronic Kidney Disease.    Anion gap 8 5 - 15  CBC     Status: Abnormal   Collection Time: 05/15/16  5:51 AM  Result Value Ref Range   WBC 23.4 (H) 4.0 - 10.5 K/uL   RBC 2.75 (L) 4.22 - 5.81 MIL/uL   Hemoglobin 10.3 (L) 13.0 - 17.0 g/dL   HCT 30.3 (L) 39.0 - 52.0 %   MCV 110.2 (H) 78.0 - 100.0 fL   MCH 37.5 (H) 26.0 - 34.0 pg   MCHC 34.0 30.0 - 36.0 g/dL   RDW 15.5 11.5 - 15.5 %   Platelets 188 150 - 400 K/uL    ABGS  Recent Labs  05/13/16 2146  TCO2 16   CULTURES No results found for this or any previous visit (from the past 240 hour(s)). Studies/Results: Dg Chest Portable 1 View  Result Date: 05/14/2016 CLINICAL DATA:  Shortness of breath. GI bleeding. History of pulmonary fibrosis and hypertension. EXAM: PORTABLE CHEST 1 VIEW COMPARISON:  CT chest 06/07/2014.  Chest 06/04/2014. FINDINGS: Shallow inspiration. Cardiac enlargement with normal pulmonary vascularity. Diffuse interstitial pattern in the lung periphery in bases consistent with usual interstitial pneumonitis. No focal consolidation or airspace disease. No blunting of costophrenic angles. No pneumothorax. IMPRESSION: Low lung volumes with interstitial fibrosis consistent with usual interstitial pneumonitis. No focal consolidation. Electronically Signed   By: Lucienne Capers M.D.   On:  05/14/2016 00:54    Medications:  Prior to Admission:  Prescriptions Prior to Admission  Medication Sig Dispense Refill Last Dose  . allopurinol (ZYLOPRIM) 300 MG tablet Take 300 mg by mouth daily.    05/13/2016 at Unknown time  . amLODipine (NORVASC) 10 MG tablet Take 10 mg by mouth daily.   1 05/13/2016 at Unknown time  . aspirin 325 MG tablet Take 325 mg by mouth daily.   05/13/2016 at Unknown time  . losartan (COZAAR) 100 MG tablet Take 100 mg by mouth daily. Once daily  1 05/13/2016 at Unknown time  . lovastatin (MEVACOR) 10 MG tablet Take 10 mg by mouth at bedtime.   05/13/2016 at Unknown time  . Nintedanib (OFEV) 150 MG CAPS Take 150 mg by mouth 2 (two) times daily.   05/13/2016 at Unknown time  .  predniSONE (DELTASONE) 20 MG tablet Take 20 mg by mouth daily with breakfast.   05/13/2016 at Unknown time   Scheduled: . allopurinol  300 mg Oral q morning - 10a  . gemfibrozil  600 mg Oral BID AC  . Nintedanib  150 mg Oral BID WC  . pantoprazole  40 mg Oral Q0600  . pravastatin  20 mg Oral q1800  . predniSONE  20 mg Oral Q breakfast  . sodium chloride flush  3 mL Intravenous Q12H   Continuous: . sodium chloride 10 mL/hr at 05/15/16 0827   DZH:[id number], zolpidem  Assesment:He was admitted with lower GI bleeding. He has macrocytic anemia and his vitamin B12 level is borderline. He has previous history of diverticular bleeding. He does not want any interventions unless its life-threatening. His hemoglobin level is stable.  At baseline he has pulmonary fibrosis and chronic hypoxic respiratory failure and that is unchanged. Active Problems:   Hypertension   Hyperlipidemia   Postinflammatory pulmonary fibrosis (HCC)   Lower GI bleed   PAC (premature atrial contraction)   GI bleed   Macrocytic anemia    Plan: Advance his diet. Assuming that he does well with this I will plan to discharge tomorrow repeat CBC tomorrow. Reduce his IV fluids.    LOS: 2 days   Charles [name]  L 05/15/2016, 8:53 AM

## 2016-05-15 NOTE — Progress Notes (Signed)
Late Entry:  At approximately 1400, pt had blood BM consisting of bright red blood, as well as dark tarry stool.  Dr. Juanetta GoslingHawkins was paged and made aware.  New orders for H&H.  Pt and wife updated at bedside.  Verbalized understanding.  Will continue to monitor.

## 2016-05-15 NOTE — Progress Notes (Signed)
REVIEWED. AGREE. NO ADDITIONAL RECOMMENDATIONS.  Labs reviewed this morning. Hgb remains stable at 10.3. Patient is refusing any endoscopic intervention unless life threatening. Will continue to follow peripherally.   Thank you for allowing us to participate in the care of Charles [name]  Eric Gill, DNP, AGNP-C Adult & Gerontological Nurse Practitioner [facility]

## 2016-05-16 DIAGNOSIS — D62 Acute posthemorrhagic anemia: Secondary | ICD-10-CM | POA: Diagnosis present

## 2016-05-16 LAB — CBC WITH DIFFERENTIAL/PLATELET
BASOS ABS: 0 10*3/uL (ref 0.0–0.1)
BASOS PCT: 0 %
EOS PCT: 0 %
Eosinophils Absolute: 0 10*3/uL (ref 0.0–0.7)
HCT: 27 % — ABNORMAL LOW (ref 39.0–52.0)
Hemoglobin: 9.1 g/dL — ABNORMAL LOW (ref 13.0–17.0)
LYMPHS PCT: 10 %
Lymphs Abs: 1.7 10*3/uL (ref 0.7–4.0)
MCH: 37.3 pg — ABNORMAL HIGH (ref 26.0–34.0)
MCHC: 33.7 g/dL (ref 30.0–36.0)
MCV: 110.7 fL — AB (ref 78.0–100.0)
Monocytes Absolute: 0.6 10*3/uL (ref 0.1–1.0)
Monocytes Relative: 4 %
NEUTROS ABS: 13.6 10*3/uL — AB (ref 1.7–7.7)
Neutrophils Relative %: 85 %
PLATELETS: 158 10*3/uL (ref 150–400)
RBC: 2.44 MIL/uL — AB (ref 4.22–5.81)
RDW: 15.4 % (ref 11.5–15.5)
WBC: 16 10*3/uL — AB (ref 4.0–10.5)

## 2016-05-16 MED ORDER — PANTOPRAZOLE SODIUM 40 MG PO TBEC: 40.0000 mg | DELAYED_RELEASE_TABLET | Freq: Every day | ORAL | 12 refills | Status: AC

## 2016-05-16 MED ORDER — FERROUS FUM-IRON POLYSACCH 162-115.2 MG PO CAPS: 1.0000 | ORAL_CAPSULE | Freq: Every day | ORAL | 2 refills | Status: AC

## 2016-05-16 NOTE — Discharge Summary (Signed)
Physician Discharge Summary  Patient ID: Charles [name] MRN: [id number] DOB/AGE: [birth year] 82 y.o. Primary Care Physician:FAGAN,ROY, MD Admit date: 05/13/2016 Discharge date: 05/16/2016    Discharge Diagnoses:   Active Problems:   Hypertension   Hyperlipidemia   Postinflammatory pulmonary fibrosis (HCC)   Lower GI bleed   PAC (premature atrial contraction)   GI bleed   Macrocytic anemia   Acute blood loss anemia     Medication List    STOP taking these medications   aspirin 325 MG tablet     TAKE these medications   allopurinol 300 MG tablet Commonly known as:  ZYLOPRIM Take 300 mg by mouth daily.   amLODipine 10 MG tablet Commonly known as:  NORVASC Take 10 mg by mouth daily.   ferrous fumarate-iron polysaccharide complex 162-115.2 MG Caps capsule Commonly known as:  TANDEM Take 1 capsule by mouth daily with breakfast.   losartan 100 MG tablet Commonly known as:  COZAAR Take 100 mg by mouth daily. Once daily   lovastatin 10 MG tablet Commonly known as:  MEVACOR Take 10 mg by mouth at bedtime.   OFEV 150 MG Caps Generic drug:  Nintedanib Take 150 mg by mouth 2 (two) times daily.   pantoprazole 40 MG tablet Commonly known as:  PROTONIX Take 1 tablet (40 mg total) by mouth daily at 6 (six) AM. Start taking on:  05/17/2016   predniSONE 20 MG tablet Commonly known as:  DELTASONE Take 20 mg by mouth daily with breakfast.       Discharged Condition:Improved/stable    Consults: GI  Significant Diagnostic Studies: Dg Chest Portable 1 View  Result Date: 05/14/2016 CLINICAL DATA:  Shortness of breath. GI bleeding. History of pulmonary fibrosis and hypertension. EXAM: PORTABLE CHEST 1 VIEW COMPARISON:  CT chest 06/07/2014.  Chest 06/04/2014. FINDINGS: Shallow inspiration. Cardiac enlargement with normal pulmonary vascularity. Diffuse interstitial pattern in the lung periphery in bases consistent with usual interstitial pneumonitis. No focal  consolidation or airspace disease. No blunting of costophrenic angles. No pneumothorax. IMPRESSION: Low lung volumes with interstitial fibrosis consistent with usual interstitial pneumonitis. No focal consolidation. Electronically Signed   By: Burman Nieves M.D.   On: 05/14/2016 00:54    Lab Results: Basic Metabolic Panel:  Recent Labs  40/98/11 2145 05/13/16 2146 05/15/16 0551  NA 137 139 138  K 4.3 4.2 4.0  CL 107 104 110  CO2 15*  --  20*  GLUCOSE 222* 210* 91  BUN 65* 61* 48*  CREATININE 1.59* 1.40* 1.28*  CALCIUM 8.0*  --  8.3*   Liver Function Tests:  Recent Labs  05/13/16 2145  AST 16  ALT 14*  ALKPHOS 61  BILITOT 0.9  PROT 5.2*  ALBUMIN 2.9*     CBC:  Recent Labs  05/13/16 2145  05/15/16 0551 05/15/16 1456 05/16/16 0514  WBC 22.5*  < > 23.4*  --  16.0*  NEUTROABS 20.4*  --   --   --  13.6*  HGB 11.5*  < > 10.3* 9.5* 9.1*  HCT 33.8*  < > 30.3* 28.2* 27.0*  MCV 111.2*  < > 110.2*  --  110.7*  PLT 200  < > 188  --  158  < > = values in this interval not displayed.  No results found for this or any previous visit (from the past 240 hour(s)).   Hospital Course: This is an 82 year old who came to the emergency department after having bright red blood per rectum. He says he had  not changed anything. When he came to the emergency department his hemoglobin level was 11.5 and eventually dropped as low as 9.1. He had macrocytosis is significant and his vitamin B12 level was borderline low. GI consultation was obtained it was recommended that he might need endoscopy/colonoscopy but he did not want to undergo those procedures. He is still having some dark blood which appears to be old. His hemoglobin level has dropped some but it seems to be mostly from equilibration not from further bleeding. We discussed again having procedures before he was discharged he did not want to do that. He says he will return immediately if he has any further significant  bleeding.  Discharge Exam: Blood pressure 105/62, pulse 97, temperature 97.3 F (36.3 C), temperature source Oral, resp. rate 20, height 5\' 10"  (1.778 m), weight 89.1 kg (196 lb 8 oz), SpO2 95 %. He is awake and alert. His chest is clear.  Disposition: Home he will start iron. He will discontinue aspirin. He can take over-the-counter vitamin B12. He will be on Protonix. He will continue his other medications      Signed: Dashan Chizmar L   05/16/2016, 7:54 AM

## 2016-05-16 NOTE — Progress Notes (Signed)
Pt discharged home today per Dr. Hawkins. Pt's IV site D/C'd and WDL. Pt's VSS. Pt provided with home medication list, discharge instructions and prescriptions. Verbalized understanding. Pt left floor via WC in stable condition accompanied by NT. 

## 2016-05-16 NOTE — Progress Notes (Signed)
Subjective: Events of yesterday noted. His hemoglobin level has dropped minimally. He says he has not had any more blood. He says it is bowel movement was mostly dark with a little bit of bright red blood on it. He wants to go home.  Objective: Vital signs in last 24 hours: Temp:  [97.3 F (36.3 C)-97.5 F (36.4 C)] 97.3 F (36.3 C) (10/07 0601) Pulse Rate:  [97-107] 97 (10/07 0601) Resp:  [20] 20 (10/07 0601) BP: (105-139)/(50-77) 105/62 (10/07 0601) SpO2:  [94 %-95 %] 95 % (10/07 0601) Weight change:  Last BM Date: 05/14/16  Intake/Output from previous day: 10/06 0701 - 10/07 0700 In: 1340.5 [I.V.:1340.5] Out: -   PHYSICAL EXAM General appearance: alert, cooperative and no distress Resp: Chronic bilateral rales Cardio: regular rate and rhythm, S1, S2 normal, no murmur, click, rub or gallop GI: soft, non-tender; bowel sounds normal; no masses,  no organomegaly Extremities: extremities normal, atraumatic, no cyanosis or edema  Lab Results:  Results for orders placed or performed during the hospital encounter of 05/13/16 (from the past 48 hour(s))  Vitamin B12     Status: None   Collection Time: 05/14/16  9:37 AM  Result Value Ref Range   Vitamin B-12 206 180 - 914 pg/mL    Comment: (NOTE) This assay is not validated for testing neonatal or myeloproliferative syndrome specimens for Vitamin B12 levels. Performed at [facility]   Iron and TIBC     Status: None   Collection Time: 05/14/16  9:37 AM  Result Value Ref Range   Iron 75 45 - 182 ug/dL   TIBC 260 250 - 450 ug/dL   Saturation Ratios 29 17.9 - 39.5 %   UIBC 185 ug/dL    Comment: Performed at [facility]  Ferritin     Status: None   Collection Time: 05/14/16  9:37 AM  Result Value Ref Range   Ferritin 294 24 - 336 ng/mL    Comment: Performed at [facility]  Folate     Status: None   Collection Time: 05/14/16  9:39 AM  Result Value Ref Range   Folate 11.4 >5.9 ng/mL    Comment:  Performed at [facility] metabolic panel     Status: Abnormal   Collection Time: 05/15/16  5:51 AM  Result Value Ref Range   Sodium 138 135 - 145 mmol/L   Potassium 4.0 3.5 - 5.1 mmol/L   Chloride 110 101 - 111 mmol/L   CO2 20 (L) 22 - 32 mmol/L   Glucose, Bld 91 65 - 99 mg/dL   BUN 48 (H) 6 - 20 mg/dL   Creatinine, Ser 1.28 (H) 0.61 - 1.24 mg/dL   Calcium 8.3 (L) 8.9 - 10.3 mg/dL   GFR calc non Af Amer 51 (L) >60 mL/min   GFR calc Af Amer 59 (L) >60 mL/min    Comment: (NOTE) The eGFR has been calculated using the CKD EPI equation. This calculation has not been validated in all clinical situations. eGFR's persistently <60 mL/min signify possible Chronic Kidney Disease.    Anion gap 8 5 - 15  CBC     Status: Abnormal   Collection Time: 05/15/16  5:51 AM  Result Value Ref Range   WBC 23.4 (H) 4.0 - 10.5 K/uL   RBC 2.75 (L) 4.22 - 5.81 MIL/uL   Hemoglobin 10.3 (L) 13.0 - 17.0 g/dL   HCT 30.3 (L) 39.0 - 52.0 %   MCV 110.2 (H) 78.0 -  100.0 fL   MCH 37.5 (H) 26.0 - 34.0 pg   MCHC 34.0 30.0 - 36.0 g/dL   RDW 15.5 11.5 - 15.5 %   Platelets 188 150 - 400 K/uL  Hemoglobin and hematocrit, blood     Status: Abnormal   Collection Time: 05/15/16  2:56 PM  Result Value Ref Range   Hemoglobin 9.5 (L) 13.0 - 17.0 g/dL   HCT 28.2 (L) 39.0 - 52.0 %  CBC with Differential/Platelet     Status: Abnormal   Collection Time: 05/16/16  5:14 AM  Result Value Ref Range   WBC 16.0 (H) 4.0 - 10.5 K/uL    Comment: MILD LEFT SHIFT (1-5% METAS, OCC MYELO, OCC BANDS) RARE NRBCs    RBC 2.44 (L) 4.22 - 5.81 MIL/uL   Hemoglobin 9.1 (L) 13.0 - 17.0 g/dL   HCT 27.0 (L) 39.0 - 52.0 %   MCV 110.7 (H) 78.0 - 100.0 fL   MCH 37.3 (H) 26.0 - 34.0 pg   MCHC 33.7 30.0 - 36.0 g/dL   RDW 15.4 11.5 - 15.5 %   Platelets 158 150 - 400 K/uL   Neutrophils Relative % 85 %   Neutro Abs 13.6 (H) 1.7 - 7.7 K/uL   Lymphocytes Relative 10 %   Lymphs Abs 1.7 0.7 - 4.0 K/uL   Monocytes Relative 4 %    Monocytes Absolute 0.6 0.1 - 1.0 K/uL   Eosinophils Relative 0 %   Eosinophils Absolute 0.0 0.0 - 0.7 K/uL   Basophils Relative 0 %   Basophils Absolute 0.0 0.0 - 0.1 K/uL    ABGS  Recent Labs  05/13/16 2146  TCO2 16   CULTURES No results found for this or any previous visit (from the past 240 hour(s)). Studies/Results: No results found.  Medications:  Prior to Admission:  Prescriptions Prior to Admission  Medication Sig Dispense Refill Last Dose  . allopurinol (ZYLOPRIM) 300 MG tablet Take 300 mg by mouth daily.    05/13/2016 at Unknown time  . amLODipine (NORVASC) 10 MG tablet Take 10 mg by mouth daily.   1 05/13/2016 at Unknown time  . aspirin 325 MG tablet Take 325 mg by mouth daily.   05/13/2016 at Unknown time  . losartan (COZAAR) 100 MG tablet Take 100 mg by mouth daily. Once daily  1 05/13/2016 at Unknown time  . lovastatin (MEVACOR) 10 MG tablet Take 10 mg by mouth at bedtime.   05/13/2016 at Unknown time  . Nintedanib (OFEV) 150 MG CAPS Take 150 mg by mouth 2 (two) times daily.   05/13/2016 at Unknown time  . predniSONE (DELTASONE) 20 MG tablet Take 20 mg by mouth daily with breakfast.   05/13/2016 at Unknown time   Scheduled: . allopurinol  300 mg Oral QHS  . gemfibrozil  600 mg Oral BID AC  . Nintedanib  150 mg Oral BID WC  . pantoprazole  40 mg Oral Q0600  . pravastatin  20 mg Oral q1800  . predniSONE  20 mg Oral Q lunch  . sodium chloride flush  3 mL Intravenous Q12H   Continuous: . sodium chloride Stopped (05/15/16 1900)   IOE:[id number], zolpidem  Assesment:He was admitted with lower GI bleeding. He has acute blood loss anemia. We discussed having EGD/colonoscopy and he does not want that. We discussed staying in the hospital another 24 hours to be sure that his hemoglobin level is indeed stable and he says he wants to go home. He does say that he will return  if he has further bleeding. He has pulmonary fibrosis which is stable Active Problems:   Hypertension    Hyperlipidemia   Postinflammatory pulmonary fibrosis (HCC)   Lower GI bleed   PAC (premature atrial contraction)   GI bleed   Macrocytic anemia    Plan: Discharge home today    LOS: 3 days   Denicia Pagliarulo L 05/16/2016, 7:50 AM

## 2016-11-08 DEATH — deceased
